# Patient Record
Sex: Female | Born: 1985
Health system: Southern US, Community
[De-identification: ages and names within clinical notes are randomized; demographics above are authoritative.]

## PROBLEM LIST (undated history)

## (undated) DIAGNOSIS — N979 Female infertility, unspecified: Secondary | ICD-10-CM

## (undated) DIAGNOSIS — F419 Anxiety disorder, unspecified: Secondary | ICD-10-CM

## (undated) DIAGNOSIS — O3660X0 Maternal care for excessive fetal growth, unspecified trimester, not applicable or unspecified: Secondary | ICD-10-CM

## (undated) HISTORY — PX: WISDOM TOOTH EXTRACTION: SHX21

---

## 2016-08-28 DIAGNOSIS — Z01419 Encounter for gynecological examination (general) (routine) without abnormal findings: Secondary | ICD-10-CM | POA: Diagnosis not present

## 2016-08-28 DIAGNOSIS — N970 Female infertility associated with anovulation: Secondary | ICD-10-CM | POA: Diagnosis not present

## 2016-08-28 DIAGNOSIS — Z6831 Body mass index (BMI) 31.0-31.9, adult: Secondary | ICD-10-CM | POA: Diagnosis not present

## 2016-09-09 DIAGNOSIS — R112 Nausea with vomiting, unspecified: Secondary | ICD-10-CM | POA: Diagnosis not present

## 2016-09-09 DIAGNOSIS — K529 Noninfective gastroenteritis and colitis, unspecified: Secondary | ICD-10-CM | POA: Diagnosis not present

## 2017-04-02 DIAGNOSIS — F411 Generalized anxiety disorder: Secondary | ICD-10-CM | POA: Diagnosis not present

## 2017-05-09 DIAGNOSIS — F411 Generalized anxiety disorder: Secondary | ICD-10-CM | POA: Diagnosis not present

## 2017-05-27 NOTE — L&D Delivery Note (Signed)
Delivery Note At 12:54 PM a viable and healthy female was delivered via Vaginal, Spontaneous (Presentation:LOA  ).  APGAR: 8, 9; weight pending .   Placenta status: spontaneous, intact.  Cord:spontaneous  with the following complications: none.  Cord pH: na  Anesthesia:  epidural Episiotomy: None Lacerations:  second Suture Repair: 2.0 3.0 vicryl rapide Est. Blood Loss (mL):  150  Mom to postpartum.  Baby to Couplet care / Skin to Skin.  Yamaris Cummings J 05/20/2018, 1:12 PM

## 2017-07-07 DIAGNOSIS — F331 Major depressive disorder, recurrent, moderate: Secondary | ICD-10-CM | POA: Diagnosis not present

## 2017-07-07 DIAGNOSIS — F411 Generalized anxiety disorder: Secondary | ICD-10-CM | POA: Diagnosis not present

## 2017-07-24 DIAGNOSIS — Z3181 Encounter for male factor infertility in female patient: Secondary | ICD-10-CM | POA: Diagnosis not present

## 2017-08-25 DIAGNOSIS — N970 Female infertility associated with anovulation: Secondary | ICD-10-CM | POA: Diagnosis not present

## 2017-08-28 DIAGNOSIS — Z3189 Encounter for other procreative management: Secondary | ICD-10-CM | POA: Diagnosis not present

## 2017-09-03 DIAGNOSIS — Z01419 Encounter for gynecological examination (general) (routine) without abnormal findings: Secondary | ICD-10-CM | POA: Diagnosis not present

## 2017-09-03 DIAGNOSIS — Z6830 Body mass index (BMI) 30.0-30.9, adult: Secondary | ICD-10-CM | POA: Diagnosis not present

## 2017-09-18 DIAGNOSIS — Z3A01 Less than 8 weeks gestation of pregnancy: Secondary | ICD-10-CM | POA: Diagnosis not present

## 2017-09-18 DIAGNOSIS — O0900 Supervision of pregnancy with history of infertility, unspecified trimester: Secondary | ICD-10-CM | POA: Diagnosis not present

## 2017-09-18 DIAGNOSIS — Z3201 Encounter for pregnancy test, result positive: Secondary | ICD-10-CM | POA: Diagnosis not present

## 2017-09-22 DIAGNOSIS — Z3A01 Less than 8 weeks gestation of pregnancy: Secondary | ICD-10-CM | POA: Diagnosis not present

## 2017-09-22 DIAGNOSIS — O0901 Supervision of pregnancy with history of infertility, first trimester: Secondary | ICD-10-CM | POA: Diagnosis not present

## 2017-10-09 DIAGNOSIS — Z3201 Encounter for pregnancy test, result positive: Secondary | ICD-10-CM | POA: Diagnosis not present

## 2017-10-21 DIAGNOSIS — Z3401 Encounter for supervision of normal first pregnancy, first trimester: Secondary | ICD-10-CM | POA: Diagnosis not present

## 2017-10-21 DIAGNOSIS — Z3689 Encounter for other specified antenatal screening: Secondary | ICD-10-CM | POA: Diagnosis not present

## 2017-10-21 LAB — OB RESULTS CONSOLE ABO/RH: RH Type: POSITIVE

## 2017-10-21 LAB — OB RESULTS CONSOLE RPR: RPR: NONREACTIVE

## 2017-10-21 LAB — OB RESULTS CONSOLE ANTIBODY SCREEN: Antibody Screen: NEGATIVE

## 2017-10-21 LAB — OB RESULTS CONSOLE HIV ANTIBODY (ROUTINE TESTING): HIV: NONREACTIVE

## 2017-10-21 LAB — OB RESULTS CONSOLE RUBELLA ANTIBODY, IGM: Rubella: IMMUNE

## 2017-10-21 LAB — OB RESULTS CONSOLE HEPATITIS B SURFACE ANTIGEN: Hepatitis B Surface Ag: NEGATIVE

## 2017-10-28 DIAGNOSIS — O0900 Supervision of pregnancy with history of infertility, unspecified trimester: Secondary | ICD-10-CM | POA: Diagnosis not present

## 2017-10-28 DIAGNOSIS — Z3A10 10 weeks gestation of pregnancy: Secondary | ICD-10-CM | POA: Diagnosis not present

## 2017-10-28 DIAGNOSIS — Z3401 Encounter for supervision of normal first pregnancy, first trimester: Secondary | ICD-10-CM | POA: Diagnosis not present

## 2017-10-28 DIAGNOSIS — O0901 Supervision of pregnancy with history of infertility, first trimester: Secondary | ICD-10-CM | POA: Diagnosis not present

## 2017-10-28 DIAGNOSIS — Z118 Encounter for screening for other infectious and parasitic diseases: Secondary | ICD-10-CM | POA: Diagnosis not present

## 2017-10-28 LAB — OB RESULTS CONSOLE GC/CHLAMYDIA
CHLAMYDIA, DNA PROBE: NEGATIVE
GC PROBE AMP, GENITAL: NEGATIVE

## 2017-11-12 DIAGNOSIS — F411 Generalized anxiety disorder: Secondary | ICD-10-CM | POA: Diagnosis not present

## 2017-11-12 DIAGNOSIS — F331 Major depressive disorder, recurrent, moderate: Secondary | ICD-10-CM | POA: Diagnosis not present

## 2017-11-24 DIAGNOSIS — Z3A14 14 weeks gestation of pregnancy: Secondary | ICD-10-CM | POA: Diagnosis not present

## 2017-11-24 DIAGNOSIS — O0902 Supervision of pregnancy with history of infertility, second trimester: Secondary | ICD-10-CM | POA: Diagnosis not present

## 2017-12-08 DIAGNOSIS — Z361 Encounter for antenatal screening for raised alphafetoprotein level: Secondary | ICD-10-CM | POA: Diagnosis not present

## 2017-12-08 DIAGNOSIS — O0902 Supervision of pregnancy with history of infertility, second trimester: Secondary | ICD-10-CM | POA: Diagnosis not present

## 2017-12-08 DIAGNOSIS — Z3A16 16 weeks gestation of pregnancy: Secondary | ICD-10-CM | POA: Diagnosis not present

## 2017-12-29 DIAGNOSIS — O0902 Supervision of pregnancy with history of infertility, second trimester: Secondary | ICD-10-CM | POA: Diagnosis not present

## 2017-12-29 DIAGNOSIS — Z363 Encounter for antenatal screening for malformations: Secondary | ICD-10-CM | POA: Diagnosis not present

## 2017-12-29 DIAGNOSIS — Z3A19 19 weeks gestation of pregnancy: Secondary | ICD-10-CM | POA: Diagnosis not present

## 2017-12-31 DIAGNOSIS — F331 Major depressive disorder, recurrent, moderate: Secondary | ICD-10-CM | POA: Diagnosis not present

## 2017-12-31 DIAGNOSIS — F411 Generalized anxiety disorder: Secondary | ICD-10-CM | POA: Diagnosis not present

## 2018-01-28 DIAGNOSIS — O0902 Supervision of pregnancy with history of infertility, second trimester: Secondary | ICD-10-CM | POA: Diagnosis not present

## 2018-01-28 DIAGNOSIS — Z3A23 23 weeks gestation of pregnancy: Secondary | ICD-10-CM | POA: Diagnosis not present

## 2018-02-26 DIAGNOSIS — F331 Major depressive disorder, recurrent, moderate: Secondary | ICD-10-CM | POA: Diagnosis not present

## 2018-02-26 DIAGNOSIS — Z23 Encounter for immunization: Secondary | ICD-10-CM | POA: Diagnosis not present

## 2018-02-26 DIAGNOSIS — Z3A28 28 weeks gestation of pregnancy: Secondary | ICD-10-CM | POA: Diagnosis not present

## 2018-02-26 DIAGNOSIS — F411 Generalized anxiety disorder: Secondary | ICD-10-CM | POA: Diagnosis not present

## 2018-02-26 DIAGNOSIS — Z3689 Encounter for other specified antenatal screening: Secondary | ICD-10-CM | POA: Diagnosis not present

## 2018-02-26 DIAGNOSIS — O0902 Supervision of pregnancy with history of infertility, second trimester: Secondary | ICD-10-CM | POA: Diagnosis not present

## 2018-03-05 DIAGNOSIS — O99810 Abnormal glucose complicating pregnancy: Secondary | ICD-10-CM | POA: Diagnosis not present

## 2018-03-05 DIAGNOSIS — Z3A29 29 weeks gestation of pregnancy: Secondary | ICD-10-CM | POA: Diagnosis not present

## 2018-03-10 DIAGNOSIS — Z3A29 29 weeks gestation of pregnancy: Secondary | ICD-10-CM | POA: Diagnosis not present

## 2018-03-10 DIAGNOSIS — O0902 Supervision of pregnancy with history of infertility, second trimester: Secondary | ICD-10-CM | POA: Diagnosis not present

## 2018-03-24 DIAGNOSIS — Z3A31 31 weeks gestation of pregnancy: Secondary | ICD-10-CM | POA: Diagnosis not present

## 2018-03-24 DIAGNOSIS — O0903 Supervision of pregnancy with history of infertility, third trimester: Secondary | ICD-10-CM | POA: Diagnosis not present

## 2018-04-06 DIAGNOSIS — O0903 Supervision of pregnancy with history of infertility, third trimester: Secondary | ICD-10-CM | POA: Diagnosis not present

## 2018-04-06 DIAGNOSIS — Z3A33 33 weeks gestation of pregnancy: Secondary | ICD-10-CM | POA: Diagnosis not present

## 2018-04-22 DIAGNOSIS — Z3685 Encounter for antenatal screening for Streptococcus B: Secondary | ICD-10-CM | POA: Diagnosis not present

## 2018-04-22 DIAGNOSIS — Z3A35 35 weeks gestation of pregnancy: Secondary | ICD-10-CM | POA: Diagnosis not present

## 2018-04-22 DIAGNOSIS — O0903 Supervision of pregnancy with history of infertility, third trimester: Secondary | ICD-10-CM | POA: Diagnosis not present

## 2018-04-22 LAB — OB RESULTS CONSOLE GBS: STREP GROUP B AG: NEGATIVE

## 2018-04-27 DIAGNOSIS — Z3A36 36 weeks gestation of pregnancy: Secondary | ICD-10-CM | POA: Diagnosis not present

## 2018-04-27 DIAGNOSIS — O0903 Supervision of pregnancy with history of infertility, third trimester: Secondary | ICD-10-CM | POA: Diagnosis not present

## 2018-05-06 DIAGNOSIS — Z3A37 37 weeks gestation of pregnancy: Secondary | ICD-10-CM | POA: Diagnosis not present

## 2018-05-06 DIAGNOSIS — O0903 Supervision of pregnancy with history of infertility, third trimester: Secondary | ICD-10-CM | POA: Diagnosis not present

## 2018-05-13 DIAGNOSIS — O3663X0 Maternal care for excessive fetal growth, third trimester, not applicable or unspecified: Secondary | ICD-10-CM | POA: Diagnosis not present

## 2018-05-13 DIAGNOSIS — Z3A38 38 weeks gestation of pregnancy: Secondary | ICD-10-CM | POA: Diagnosis not present

## 2018-05-15 ENCOUNTER — Other Ambulatory Visit: Payer: Self-pay | Admitting: Obstetrics and Gynecology

## 2018-05-19 ENCOUNTER — Inpatient Hospital Stay (HOSPITAL_COMMUNITY): Payer: BLUE CROSS/BLUE SHIELD | Admitting: Anesthesiology

## 2018-05-19 ENCOUNTER — Encounter (HOSPITAL_COMMUNITY): Payer: Self-pay

## 2018-05-19 ENCOUNTER — Encounter (HOSPITAL_COMMUNITY): Payer: Self-pay | Admitting: *Deleted

## 2018-05-19 ENCOUNTER — Inpatient Hospital Stay (HOSPITAL_BASED_OUTPATIENT_CLINIC_OR_DEPARTMENT_OTHER): Payer: BLUE CROSS/BLUE SHIELD

## 2018-05-19 ENCOUNTER — Inpatient Hospital Stay (EMERGENCY_DEPARTMENT_HOSPITAL)
Admission: AD | Admit: 2018-05-19 | Discharge: 2018-05-19 | Disposition: A | Discharge status: Home / Self Care | Payer: BLUE CROSS/BLUE SHIELD | Source: Home / Self Care | Attending: Obstetrics and Gynecology | Admitting: Obstetrics and Gynecology

## 2018-05-19 ENCOUNTER — Inpatient Hospital Stay (HOSPITAL_COMMUNITY)
Admission: AD | Admit: 2018-05-19 | Discharge: 2018-05-22 | DRG: 806 | Disposition: A | Discharge status: Home / Self Care | Payer: BLUE CROSS/BLUE SHIELD | Attending: Obstetrics and Gynecology | Admitting: Obstetrics and Gynecology

## 2018-05-19 DIAGNOSIS — Z3490 Encounter for supervision of normal pregnancy, unspecified, unspecified trimester: Secondary | ICD-10-CM

## 2018-05-19 DIAGNOSIS — D62 Acute posthemorrhagic anemia: Secondary | ICD-10-CM | POA: Diagnosis not present

## 2018-05-19 DIAGNOSIS — N939 Abnormal uterine and vaginal bleeding, unspecified: Secondary | ICD-10-CM

## 2018-05-19 DIAGNOSIS — Z3A39 39 weeks gestation of pregnancy: Secondary | ICD-10-CM

## 2018-05-19 DIAGNOSIS — O4693 Antepartum hemorrhage, unspecified, third trimester: Secondary | ICD-10-CM

## 2018-05-19 DIAGNOSIS — F329 Major depressive disorder, single episode, unspecified: Secondary | ICD-10-CM | POA: Diagnosis present

## 2018-05-19 DIAGNOSIS — O3663X0 Maternal care for excessive fetal growth, third trimester, not applicable or unspecified: Principal | ICD-10-CM | POA: Diagnosis present

## 2018-05-19 DIAGNOSIS — O26893 Other specified pregnancy related conditions, third trimester: Secondary | ICD-10-CM | POA: Diagnosis not present

## 2018-05-19 DIAGNOSIS — O99344 Other mental disorders complicating childbirth: Secondary | ICD-10-CM | POA: Diagnosis present

## 2018-05-19 DIAGNOSIS — O9081 Anemia of the puerperium: Secondary | ICD-10-CM | POA: Diagnosis not present

## 2018-05-19 DIAGNOSIS — Z3483 Encounter for supervision of other normal pregnancy, third trimester: Secondary | ICD-10-CM | POA: Diagnosis not present

## 2018-05-19 HISTORY — DX: Maternal care for excessive fetal growth, unspecified trimester, not applicable or unspecified: O36.60X0

## 2018-05-19 HISTORY — DX: Anxiety disorder, unspecified: F41.9

## 2018-05-19 HISTORY — DX: Female infertility, unspecified: N97.9

## 2018-05-19 LAB — CBC
HCT: 35 % — ABNORMAL LOW (ref 36.0–46.0)
Hemoglobin: 11.4 g/dL — ABNORMAL LOW (ref 12.0–15.0)
MCH: 29 pg (ref 26.0–34.0)
MCHC: 32.6 g/dL (ref 30.0–36.0)
MCV: 89.1 fL (ref 80.0–100.0)
Platelets: 210 10*3/uL (ref 150–400)
RBC: 3.93 MIL/uL (ref 3.87–5.11)
RDW: 14.9 % (ref 11.5–15.5)
WBC: 10.8 10*3/uL — ABNORMAL HIGH (ref 4.0–10.5)
nRBC: 0 % (ref 0.0–0.2)

## 2018-05-19 LAB — WET PREP, GENITAL
Clue Cells Wet Prep HPF POC: NONE SEEN
Sperm: NONE SEEN
Trich, Wet Prep: NONE SEEN
Yeast Wet Prep HPF POC: NONE SEEN

## 2018-05-19 LAB — TYPE AND SCREEN
ABO/RH(D): O POS
Antibody Screen: NEGATIVE

## 2018-05-19 IMAGING — US US MFM OB LIMITED
1 series · 15 of 19 positions shown · non-contrast
Comparison: none

[Series 1: us mfm ob limited · 19 acquisitions, 15 frames shown]
[im 1/19]
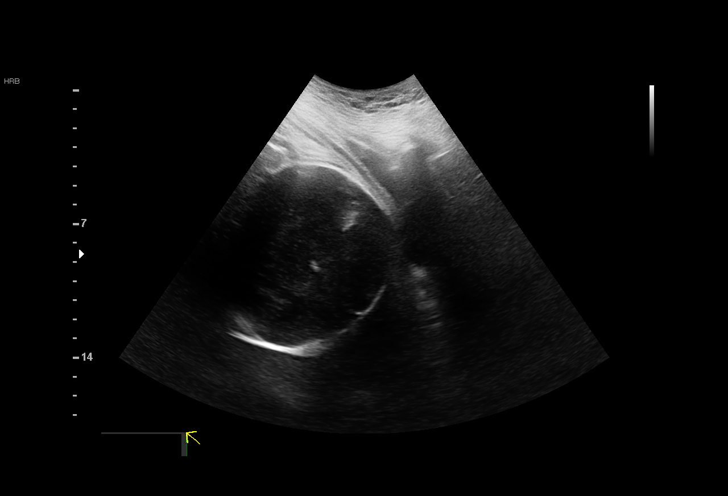
[im 2/19]
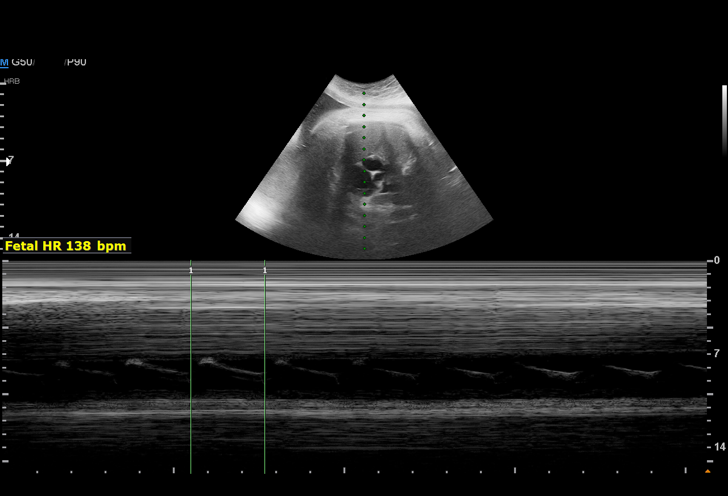
[im 4/19]
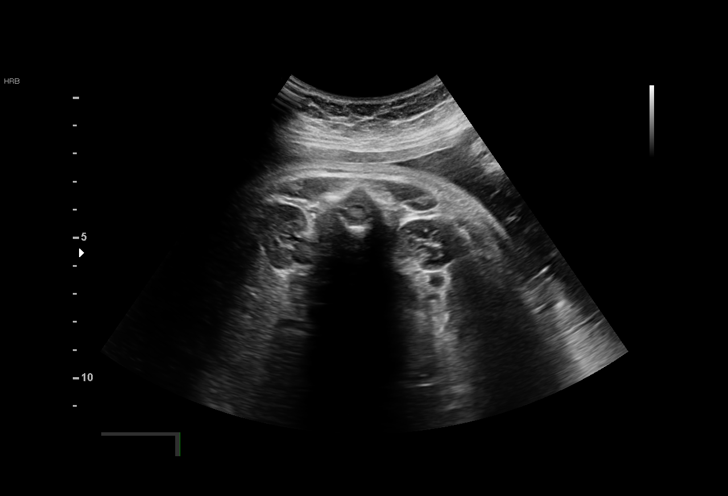
[im 5/19]
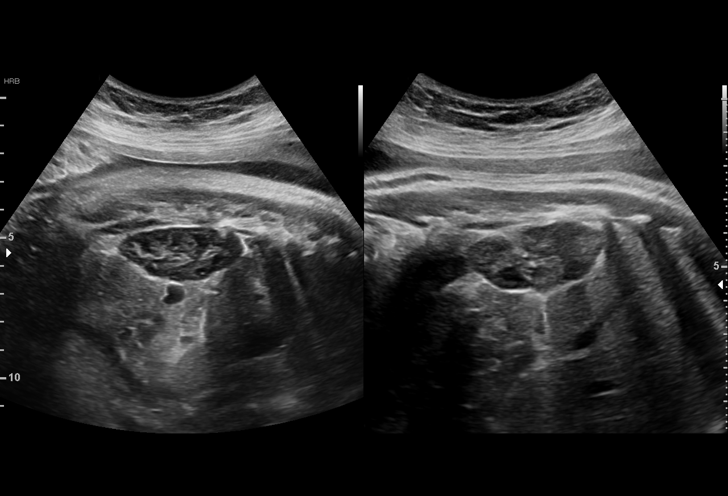
[im 6/19]
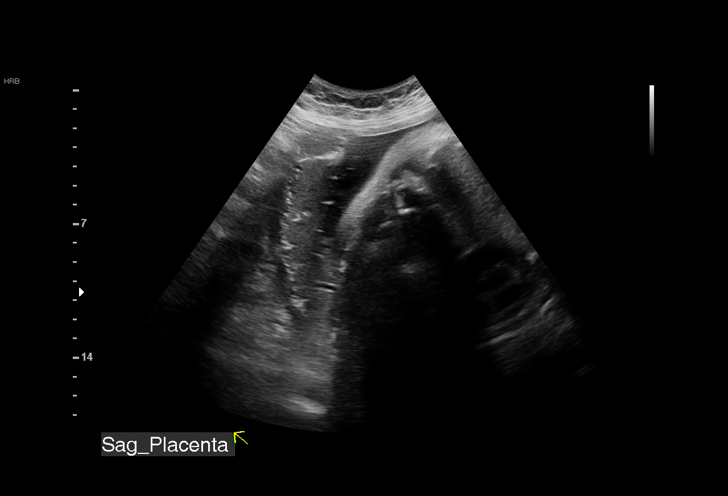
[im 7/19]
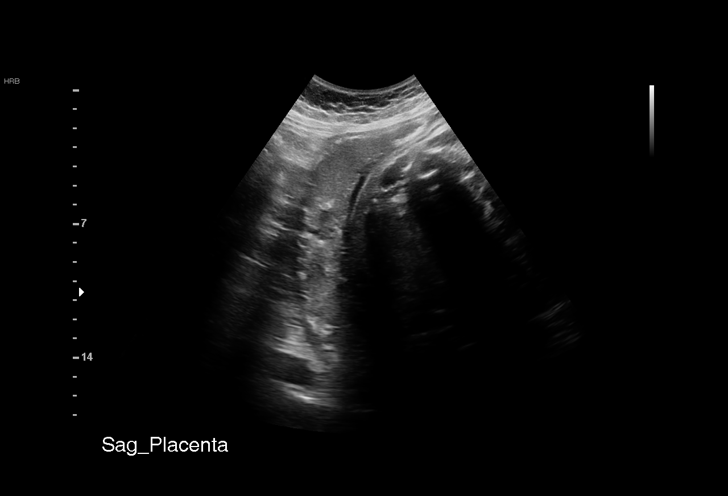
[im 9/19]
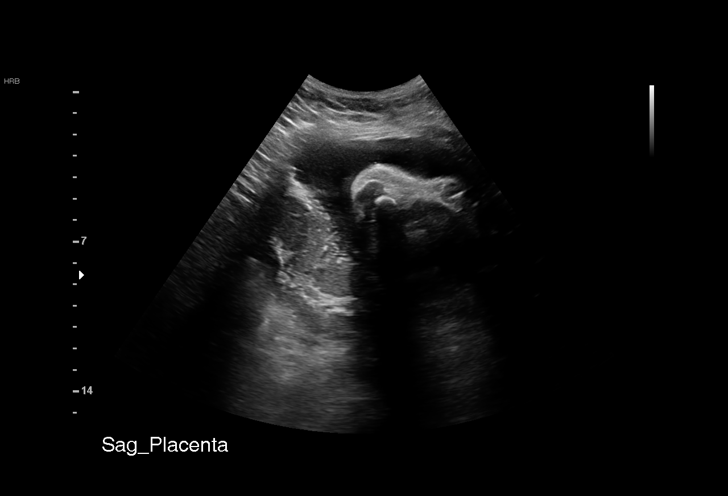
[im 10/19]
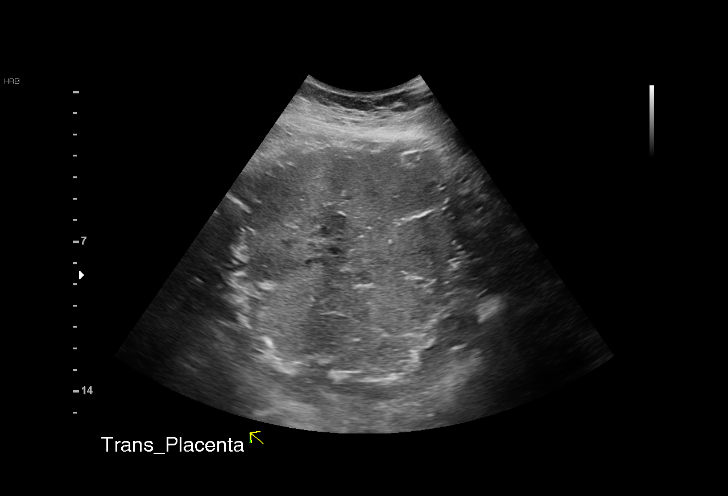
[im 11/19]
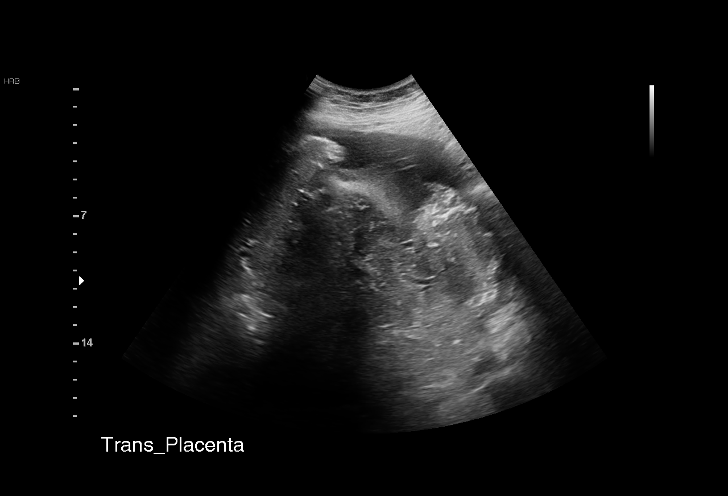
[im 13/19]
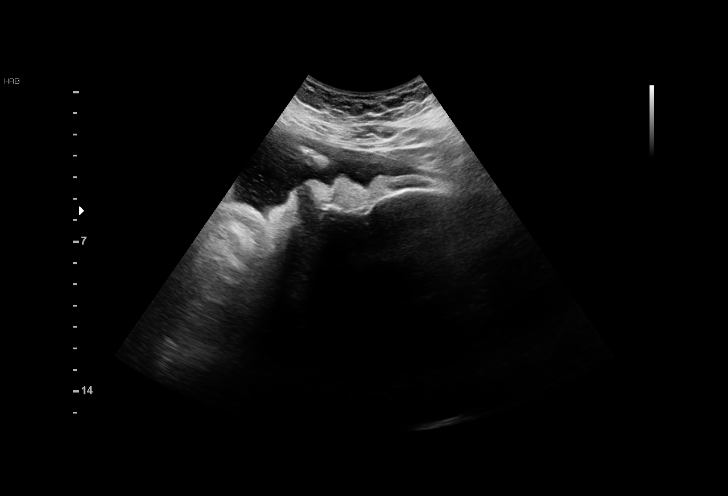
[im 14/19]
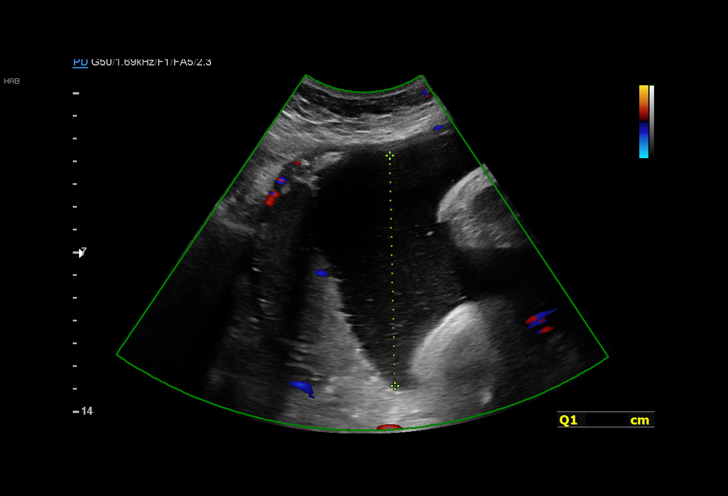
[im 15/19]
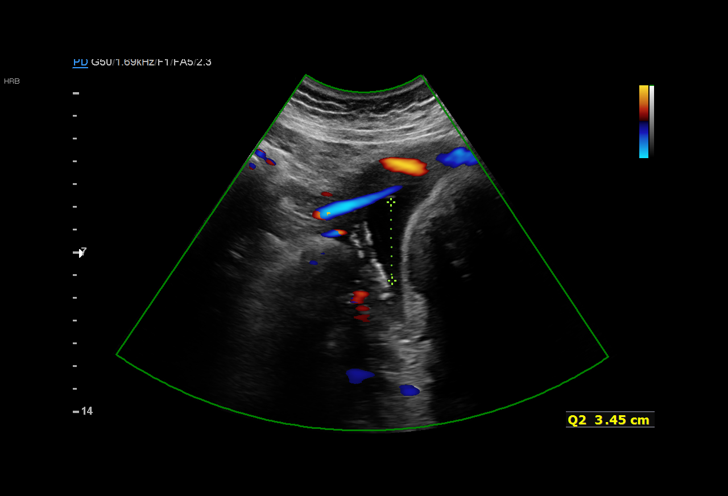
[im 16/19]
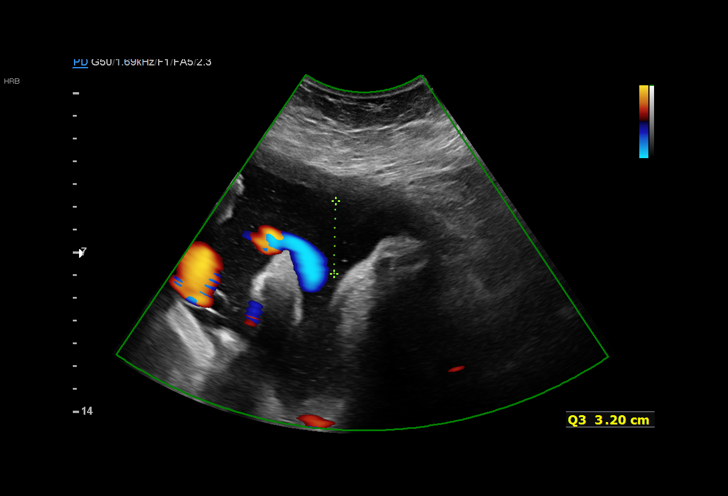
[im 18/19]
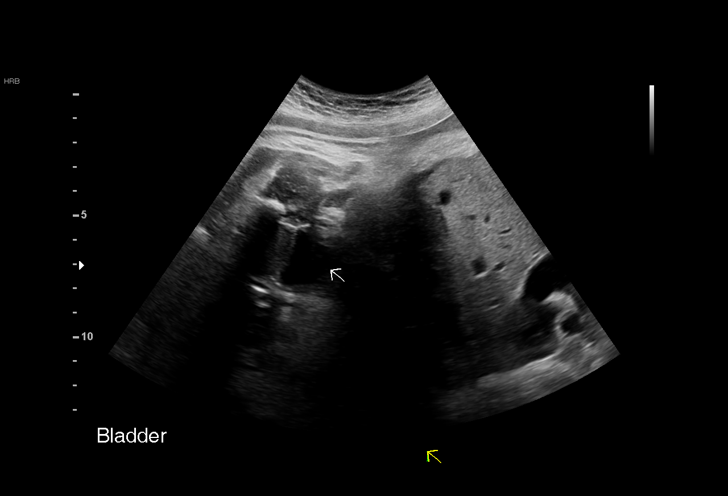
[im 19/19]
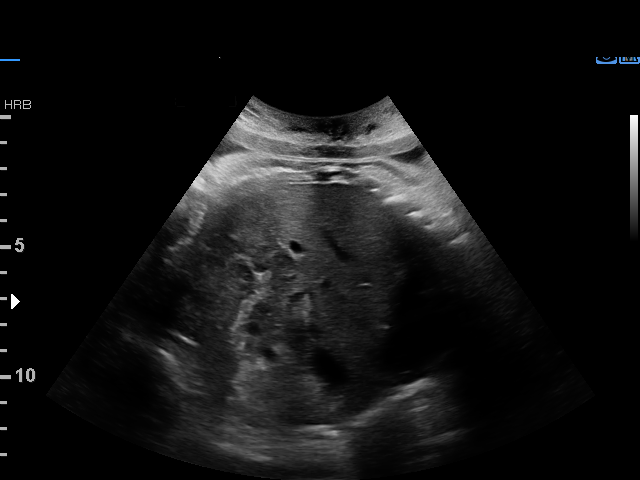

[15 of 19 positions shown; findings below may reference images not displayed]

1  US MFM OB LIMITED                    76815.01     ROSOBEL
 ----------------------------------------------------------------------

 ----------------------------------------------------------------------
Indications

  Vaginal bleeding in pregnancy, third           [47]
  trimester (brown blood in underware)
  Pelvic pain affecting pregnancy in third       [47]
  trimester (lower abdominal cramping- like
  period cramps)
  39 weeks gestation of pregnancy
 ----------------------------------------------------------------------
Vital Signs

 BMI:
Fetal Evaluation

 Num Of Fetuses:          1
 Fetal Heart Rate(bpm):   138
 Cardiac Activity:        Observed
 Presentation:            Cephalic
 Placenta:                Posterior

 Amniotic Fluid
 AFI FV:      Within normal limits

 AFI Sum(cm)     %Tile       Largest Pocket(cm)
 21.[REDACTED]

 RUQ(cm)       RLQ(cm)       LUQ(cm)        LLQ(cm)

OB History

 Gravidity:    1
Gestational Age
 Best:          39w 5d     Det. By:  Previous Ultrasound      EDD:   [DATE]
Anatomy

 Thoracic:              Appears normal         Abdomen:                Appears normal
 Diaphragm:             Appears normal         Kidneys:                Appear normal
 Stomach:               Appears normal, left   Bladder:                Appears normal
                        sided
Cervix Uterus Adnexa

 Cervix
 Not visualized (advanced GA >[47])
Impression

 No evidence of placenta previa or placental abruption
 Vaginal bleeding
Recommendations

 Follow up as clinically indicated.

## 2018-05-19 MED ORDER — FENTANYL 2.5 MCG/ML BUPIVACAINE 1/10 % EPIDURAL INFUSION (WH - ANES)
INTRAMUSCULAR | Status: AC
  Filled 2018-05-19: qty 100

## 2018-05-19 MED ORDER — LACTATED RINGERS IV SOLN
INTRAVENOUS | Status: DC
  Administered 2018-05-19 – 2018-05-20 (×3): via INTRAVENOUS

## 2018-05-19 MED ORDER — OXYTOCIN BOLUS FROM INFUSION
500.0000 mL | Freq: Once | INTRAVENOUS | Status: AC
  Administered 2018-05-20: 500 mL via INTRAVENOUS

## 2018-05-19 MED ORDER — ACETAMINOPHEN 325 MG PO TABS: 650.0000 mg | ORAL_TABLET | ORAL | Status: DC | PRN

## 2018-05-19 MED ORDER — SOD CITRATE-CITRIC ACID 500-334 MG/5ML PO SOLN: 30.0000 mL | ORAL | Status: DC | PRN

## 2018-05-19 MED ORDER — LIDOCAINE HCL (PF) 1 % IJ SOLN
30.0000 mL | INTRAMUSCULAR | Status: DC | PRN
  Administered 2018-05-20: 30 mL via SUBCUTANEOUS
  Filled 2018-05-19: qty 30

## 2018-05-19 MED ORDER — ONDANSETRON HCL 4 MG/2ML IJ SOLN: 4.0000 mg | Freq: Four times a day (QID) | INTRAMUSCULAR | Status: DC | PRN

## 2018-05-19 MED ORDER — LACTATED RINGERS IV SOLN
500.0000 mL | INTRAVENOUS | Status: DC | PRN
  Administered 2018-05-19: 500 mL via INTRAVENOUS

## 2018-05-19 MED ORDER — PHENYLEPHRINE 40 MCG/ML (10ML) SYRINGE FOR IV PUSH (FOR BLOOD PRESSURE SUPPORT)
PREFILLED_SYRINGE | INTRAVENOUS | Status: AC
  Filled 2018-05-19: qty 10

## 2018-05-19 MED ORDER — OXYTOCIN 40 UNITS IN LACTATED RINGERS INFUSION - SIMPLE MED: 2.5000 [IU]/h | INTRAVENOUS | Status: DC

## 2018-05-19 MED ORDER — LIDOCAINE HCL (PF) 1 % IJ SOLN
INTRAMUSCULAR | Status: DC | PRN
  Administered 2018-05-19: 2 mL via EPIDURAL
  Administered 2018-05-19: 5 mL via EPIDURAL
  Administered 2018-05-19: 3 mL via EPIDURAL

## 2018-05-19 MED ORDER — FENTANYL 2.5 MCG/ML BUPIVACAINE 1/10 % EPIDURAL INFUSION (WH - ANES)
INTRAMUSCULAR | Status: DC | PRN
  Administered 2018-05-19: 14 mL/h via EPIDURAL

## 2018-05-19 NOTE — Progress Notes (Signed)
All evals from 2208 assessed and performed by Harmon Dunhandra P RN

## 2018-05-19 NOTE — MAU Note (Signed)
Urine in the lab  

## 2018-05-19 NOTE — Anesthesia Preprocedure Evaluation (Signed)
Anesthesia Evaluation  Patient identified by MRN, date of birth, ID band Patient awake    Reviewed: Allergy & Precautions, Patient's Chart, lab work & pertinent test results  Airway Mallampati: II  TM Distance: >3 FB     Dental   Pulmonary neg pulmonary ROS,    breath sounds clear to auscultation       Cardiovascular negative cardio ROS   Rhythm:Regular Rate:Normal     Neuro/Psych negative neurological ROS     GI/Hepatic negative GI ROS, Neg liver ROS,   Endo/Other  negative endocrine ROS  Renal/GU negative Renal ROS     Musculoskeletal   Abdominal   Peds  Hematology negative hematology ROS (+)   Anesthesia Other Findings   Reproductive/Obstetrics (+) Pregnancy                             Lab Results  Component Value Date   WBC 10.8 (H) 05/19/2018   HGB 11.4 (L) 05/19/2018   HCT 35.0 (L) 05/19/2018   MCV 89.1 05/19/2018   PLT 210 05/19/2018   No results found for: CREATININE, BUN, NA, K, CL, CO2  Anesthesia Physical Anesthesia Plan  ASA: II  Anesthesia Plan: Epidural   Post-op Pain Management:    Induction:   PONV Risk Score and Plan: Treatment may vary due to age or medical condition  Airway Management Planned: Natural Airway  Additional Equipment:   Intra-op Plan:   Post-operative Plan:   Informed Consent: I have reviewed the patients History and Physical, chart, labs and discussed the procedure including the risks, benefits and alternatives for the proposed anesthesia with the patient or authorized representative who has indicated his/her understanding and acceptance.     Plan Discussed with:   Anesthesia Plan Comments:         Anesthesia Quick Evaluation

## 2018-05-19 NOTE — MAU Note (Signed)
Pt has had ctx since this morning. About 6 min apart. Bloody show noted since this morning. Good fetal movement felt. 2cm dilated earlier today

## 2018-05-19 NOTE — MAU Note (Signed)
Pt states she woke up this morning, went to BR, noted brownish blood in her underwear & in the toilet.  Also having lower abdominal cramping.  Reports good fetal movement.

## 2018-05-19 NOTE — Progress Notes (Signed)
All evals from 2205 assessed and performed by Harmon Dunhandra P RN

## 2018-05-19 NOTE — Progress Notes (Signed)
Fetal tachy 180bpm from 2241-2150. FHR changed back to baseline of 130bpm 2252. Dorris FetchKathy W RN (charge) informed. No interventions recommended at this time.   Adah Perlhandra Viviene Thurston RN

## 2018-05-19 NOTE — H&P (Signed)
Jill Ruiz is a 32 y.o. female presenting for contractions and early labor. OB History    Gravida  1   Para      Term      Preterm      AB      Living        SAB      TAB      Ectopic      Multiple      Live Births             Past Medical History:  Diagnosis Date  . Infertility, female   . LGA (large for gestational age) fetus affecting management of mother    Past Surgical History:  Procedure Laterality Date  . WISDOM TOOTH EXTRACTION     Family History: family history includes Cancer in her mother. Social History:  reports that she has never smoked. She has never used smokeless tobacco. She reports previous alcohol use. She reports that she does not use drugs.     Maternal Diabetes: No Genetic Screening: Normal Maternal Ultrasounds/Referrals: Normal Fetal Ultrasounds or other Referrals:  None Maternal Substance Abuse:  No Significant Maternal Medications:  None Significant Maternal Lab Results:  None Other Comments:  None  Review of Systems  Constitutional: Negative.   All other systems reviewed and are negative.  Maternal Medical History:  Reason for admission: Contractions.   Contractions: Onset was 3-5 hours ago.   Frequency: irregular.   Perceived severity is moderate.    Fetal activity: Perceived fetal activity is normal.   Last perceived fetal movement was within the past hour.    Prenatal complications: no prenatal complications Prenatal Complications - Diabetes: none.    Dilation: 5.5(5.5-6) Effacement (%): 80(80-90) Station: 0 Exam by:: Jill Perlhandra Phillips RN Blood pressure 127/87, pulse (!) 107, temperature (!) 97.5 F (36.4 C), resp. rate 18. Maternal Exam:  Uterine Assessment: Contraction strength is moderate.  Contraction frequency is irregular.   Abdomen: Patient reports no abdominal tenderness. Fetal presentation: vertex  Introitus: Normal vulva. Normal vagina.  Ferning test: not done.  Nitrazine test: not  done. Amniotic fluid character: not assessed.  Pelvis: questionable for delivery.   Cervix: Cervix evaluated by digital exam.     Physical Exam  Nursing note and vitals reviewed. Constitutional: She is oriented to person, place, and time. She appears well-developed and well-nourished.  HENT:  Head: Normocephalic and atraumatic.  Neck: Normal range of motion. Neck supple.  Cardiovascular: Normal rate and regular rhythm.  Respiratory: Effort normal and breath sounds normal.  GI: Soft. Bowel sounds are normal.  Genitourinary:    Vulva, vagina and uterus normal.   Musculoskeletal: Normal range of motion.  Neurological: She is alert and oriented to person, place, and time. She has normal reflexes.  Skin: Skin is warm and dry.  Psychiatric: She has a normal mood and affect.    Prenatal labs: ABO, Rh: O/Positive/-- (05/28 0000) Antibody: Negative (05/28 0000) Rubella: Immune (05/28 0000) RPR: Nonreactive (05/28 0000)  HBsAg: Negative (05/28 0000)  HIV: Non-reactive (05/28 0000)  GBS: Negative (11/27 0000)   Assessment/Plan: Term IUP Early labor Presumed LGA Admit   Jill Ruiz J 05/19/2018, 11:03 PM

## 2018-05-19 NOTE — Anesthesia Procedure Notes (Signed)
Epidural Patient location during procedure: OB Start time: 05/19/2018 11:33 PM End time: 05/19/2018 11:39 PM  Staffing Anesthesiologist: Marcene DuosFitzgerald, Hillis Mcphatter, MD Performed: anesthesiologist   Preanesthetic Checklist Completed: patient identified, site marked, surgical consent, pre-op evaluation, timeout performed, IV checked, risks and benefits discussed and monitors and equipment checked  Epidural Patient position: sitting Prep: site prepped and draped and DuraPrep Patient monitoring: continuous pulse ox and blood pressure Approach: midline Location: L4-L5 Injection technique: LOR air  Needle:  Needle type: Tuohy  Needle gauge: 17 G Needle length: 9 cm and 9 Needle insertion depth: 7 cm Catheter type: closed end flexible Catheter size: 19 Gauge Catheter at skin depth: 12 cm Test dose: negative  Assessment Events: blood not aspirated, injection not painful, no injection resistance, negative IV test and no paresthesia

## 2018-05-19 NOTE — MAU Provider Note (Signed)
Patient Jill Ruiz is a G1P0 At 1672w5d here with complaints of spotting this morning when she used the bathroom. She denies HA, blurry vision, nausea vomiting, LOF or decreased fetal movements. She feels some cramping. Last cervical exam was last week and she was 1 cm; she has not had intercourse in the past 24 hours.  History     CSN: [id number]  Arrival date and time: 05/19/18 0702   First Provider Initiated Contact with Patient 05/19/18 [phone]      Chief Complaint  Patient presents with  . Vaginal Bleeding  . Abdominal Pain   Vaginal Bleeding  The patient's primary symptoms include vaginal bleeding. The patient's pertinent negatives include no genital itching or genital lesions. This is a new problem. The current episode started today. The problem occurs rarely. The problem has been resolved. The pain is mild. Associated symptoms include diarrhea. Pertinent negatives include no constipation, urgency or vomiting. The vaginal discharge was bloody. The vaginal bleeding is spotting. She has not been passing clots. She has not been passing tissue. Nothing aggravates the symptoms. She has tried nothing for the symptoms.  Patient states that she went to the bathroom this morning and had some brown and pink discharge on her toilet paper and in the toilet. Has not had any bleeding since then.  She has some abdominal pain that she rates a 2/10; feels like cramping  OB History    Gravida  1   Para      Term      Preterm      AB      Living        SAB      TAB      Ectopic      Multiple      Live Births              Past Medical History:  Diagnosis Date  . Medical history non-contributory     Past Surgical History:  Procedure Laterality Date  . WISDOM TOOTH EXTRACTION      No family history on file.  Social History   Tobacco Use  . Smoking status: Never Smoker  . Smokeless tobacco: Never Used  Substance Use Topics  . Alcohol use: Not Currently  . Drug  use: Never    Allergies: Allergies not on file  No medications prior to admission.    Review of Systems  Constitutional: Negative.   HENT: Negative.   Respiratory: Negative.   Gastrointestinal: Positive for diarrhea. Negative for constipation and vomiting.  Genitourinary: Positive for vaginal bleeding. Negative for urgency.  Musculoskeletal: Negative.   Neurological: Negative.   Psychiatric/Behavioral: Negative.    Physical Exam   Blood pressure 116/77, pulse 98, temperature 97.7 F (36.5 C), temperature source Oral, resp. rate 18, height 5' 4.5" (1.638 m), weight 93.4 kg.  Physical Exam  Constitutional: She appears well-developed and well-nourished.  HENT:  Head: Normocephalic.  Neck: Normal range of motion.  Genitourinary:    Genitourinary Comments: External vagina is normal, no lesions. Dark brown mucous discharge in the vagina; upon touching speculum to cervix dark brown blood oozed from the os; did not fill speculum and did not continue bleeding.    Musculoskeletal: Normal range of motion.  Neurological: She is alert.  Skin: Skin is warm and dry.  Psychiatric: She has a normal mood and affect.    MAU Course  Procedures  MDM -wet prep negative -NST: 120 bpm, mod var, present acel, neg decels, conts  q 5 mintes -US shows no sign of placental abruption -Discussed with Dr. Billy Coastaavon if he prefers to wait or induce the patient today; Dr. Billy Coastaavon recommends waiting. Patient is amenable to this plan.   Assessment and Plan   1. Vaginal bleeding    2. Patient stable for discharge with labor and return precautions (bleeding, etc).   3. Reviewed when to call [facility]; plan to keep appt on 12-26.   4. All questions answered; patient stable for discharge.   Charlesetta GaribaldiKathryn Lorraine Kohan Azizi 05/19/2018, 8:27 AM

## 2018-05-19 NOTE — Discharge Instructions (Signed)
Labor Induction    Labor induction is when steps are taken to cause a pregnant woman to begin the labor process. Most women go into labor on their own between 37 weeks and 42 weeks of pregnancy. When this does not happen or when there is a medical need for labor to begin, steps may be taken to induce labor. Labor induction causes a pregnant woman's uterus to contract. It also causes the cervix to soften (ripen), open (dilate), and thin out (efface). Usually, labor is not induced before 39 weeks of pregnancy unless there is a medical reason to do so. Your health care provider will determine if labor induction is needed.  Before inducing labor, your health care provider will consider a number of factors, including:  · Your medical condition and your baby's.  · How many weeks along you are in your pregnancy.  · How mature your baby's lungs are.  · The condition of your cervix.  · The position of your baby.  · The size of your birth canal.  What are some reasons for labor induction?  Labor may be induced if:  · Your health or your baby's health is at risk.  · Your pregnancy is overdue by 1 week or more.  · Your water breaks but labor does not start on its own.  · There is a low amount of amniotic fluid around your baby.  You may also choose (elect) to have labor induced at a certain time. Generally, elective labor induction is done no earlier than 39 weeks of pregnancy.  What methods are used for labor induction?  Methods used for labor induction include:  · Prostaglandin medicine. This medicine starts contractions and causes the cervix to dilate and ripen. It can be taken by mouth (orally) or by being inserted into the vagina (suppository).  · Inserting a small, thin tube (catheter) with a balloon into the vagina and then expanding the balloon with water to dilate the cervix.  · Stripping the membranes. In this method, your health care provider gently separates amniotic sac tissue from the cervix. This causes the  cervix to stretch, which in turn causes the release of a hormone called progesterone. The hormone causes the uterus to contract. This procedure is often done during an office visit, after which you will be sent home to wait for contractions to begin.  · Breaking the water. In this method, your health care provider uses a small instrument to make a small hole in the amniotic sac. This eventually causes the amniotic sac to break. Contractions should begin after a few hours.  · Medicine to trigger or strengthen contractions. This medicine is given through an IV that is inserted into a vein in your arm.  Except for membrane stripping, which can be done in a clinic, labor induction is done in the hospital so that you and your baby can be carefully monitored.  How long does it take for labor to be induced?  The length of time it takes to induce labor depends on how ready your body is for labor. Some inductions can take up to 2-3 days, while others may take less than a day. Induction may take longer if:  · You are induced early in your pregnancy.  · It is your first pregnancy.  · Your cervix is not ready.  What are some risks associated with labor induction?  Some risks associated with labor induction include:  · Changes in fetal heart rate, such as being too   high, too low, or irregular (erratic).  · Failed induction.  · Infection in the mother or the baby.  · Increased risk of having a cesarean delivery.  · Fetal death.  · Breaking off (abruption) of the placenta from the uterus (rare).  · Rupture of the uterus (very rare).  When induction is needed for medical reasons, the benefits of induction generally outweigh the risks.  What are some reasons for not inducing labor?  Labor induction should not be done if:  · Your baby does not tolerate contractions.  · You have had previous surgeries on your uterus, such as a myomectomy, removal of fibroids, or a vertical scar from a previous cesarean delivery.  · Your placenta lies  very low in your uterus and blocks the opening of the cervix (placenta previa).  · Your baby is not in a head-down position.  · The umbilical cord drops down into the birth canal in front of the baby.  · There are unusual circumstances, such as the baby being very early (premature).  · You have had more than 2 previous cesarean deliveries.  Summary  · Labor induction is when steps are taken to cause a pregnant woman to begin the labor process.  · Labor induction causes a pregnant woman's uterus to contract. It also causes the cervix to ripen, dilate, and efface.  · Labor is not induced before 39 weeks of pregnancy unless there is a medical reason to do so.  · When induction is needed for medical reasons, the benefits of induction generally outweigh the risks.  This information is not intended to replace advice given to you by your health care provider. Make sure you discuss any questions you have with your health care provider.  Document Released: 10/02/2006 Document Revised: 06/26/2016 Document Reviewed: 06/26/2016  Elsevier Interactive Patient Education © 2019 [employer]

## 2018-05-20 ENCOUNTER — Other Ambulatory Visit: Payer: Self-pay

## 2018-05-20 ENCOUNTER — Encounter (HOSPITAL_COMMUNITY): Payer: Self-pay

## 2018-05-20 LAB — ABO/RH: ABO/RH(D): O POS

## 2018-05-20 MED ORDER — PHENYLEPHRINE 40 MCG/ML (10ML) SYRINGE FOR IV PUSH (FOR BLOOD PRESSURE SUPPORT)
80.0000 ug | PREFILLED_SYRINGE | INTRAVENOUS | Status: DC | PRN
  Filled 2018-05-20: qty 10

## 2018-05-20 MED ORDER — LACTATED RINGERS IV SOLN: 500.0000 mL | Freq: Once | INTRAVENOUS | Status: DC

## 2018-05-20 MED ORDER — ONDANSETRON HCL 4 MG/2ML IJ SOLN: 4.0000 mg | INTRAMUSCULAR | Status: DC | PRN

## 2018-05-20 MED ORDER — IBUPROFEN 600 MG PO TABS
600.0000 mg | ORAL_TABLET | Freq: Four times a day (QID) | ORAL | Status: DC
  Administered 2018-05-20 – 2018-05-22 (×8): 600 mg via ORAL
  Filled 2018-05-20 (×8): qty 1

## 2018-05-20 MED ORDER — TETANUS-DIPHTH-ACELL PERTUSSIS 5-2.5-18.5 LF-MCG/0.5 IM SUSP: 0.5000 mL | Freq: Once | INTRAMUSCULAR | Status: DC

## 2018-05-20 MED ORDER — TERBUTALINE SULFATE 1 MG/ML IJ SOLN: 0.2500 mg | Freq: Once | INTRAMUSCULAR | Status: DC | PRN

## 2018-05-20 MED ORDER — EPHEDRINE 5 MG/ML INJ
10.0000 mg | INTRAVENOUS | Status: DC | PRN
  Filled 2018-05-20: qty 2

## 2018-05-20 MED ORDER — OXYTOCIN 40 UNITS IN LACTATED RINGERS INFUSION - SIMPLE MED: 1.0000 m[IU]/min | INTRAVENOUS | Status: DC

## 2018-05-20 MED ORDER — TERBUTALINE SULFATE 1 MG/ML IJ SOLN
0.2500 mg | Freq: Once | INTRAMUSCULAR | Status: DC | PRN
  Filled 2018-05-20: qty 1

## 2018-05-20 MED ORDER — OXYTOCIN 40 UNITS IN LACTATED RINGERS INFUSION - SIMPLE MED
1.0000 m[IU]/min | INTRAVENOUS | Status: DC
  Filled 2018-05-20: qty 1000

## 2018-05-20 MED ORDER — METHYLERGONOVINE MALEATE 0.2 MG PO TABS: 0.2000 mg | ORAL_TABLET | ORAL | Status: DC | PRN

## 2018-05-20 MED ORDER — OXYTOCIN 40 UNITS IN LACTATED RINGERS INFUSION - SIMPLE MED
1.0000 m[IU]/min | INTRAVENOUS | Status: DC
  Administered 2018-05-20: 6 m[IU]/min via INTRAVENOUS
  Administered 2018-05-20: 3 m[IU]/min via INTRAVENOUS
  Administered 2018-05-20: 4 m[IU]/min via INTRAVENOUS
  Administered 2018-05-20: 1 m[IU]/min via INTRAVENOUS

## 2018-05-20 MED ORDER — ZOLPIDEM TARTRATE 5 MG PO TABS: 5.0000 mg | ORAL_TABLET | Freq: Every evening | ORAL | Status: DC | PRN

## 2018-05-20 MED ORDER — ACETAMINOPHEN 325 MG PO TABS
650.0000 mg | ORAL_TABLET | ORAL | Status: DC | PRN
  Administered 2018-05-21: 650 mg via ORAL
  Filled 2018-05-20: qty 2

## 2018-05-20 MED ORDER — SIMETHICONE 80 MG PO CHEW: 80.0000 mg | CHEWABLE_TABLET | ORAL | Status: DC | PRN

## 2018-05-20 MED ORDER — COCONUT OIL OIL
1.0000 "application " | TOPICAL_OIL | Status: DC | PRN
  Administered 2018-05-22: 1 via TOPICAL
  Filled 2018-05-20: qty 120

## 2018-05-20 MED ORDER — ONDANSETRON HCL 4 MG PO TABS: 4.0000 mg | ORAL_TABLET | ORAL | Status: DC | PRN

## 2018-05-20 MED ORDER — OXYCODONE-ACETAMINOPHEN 5-325 MG PO TABS: 2.0000 | ORAL_TABLET | ORAL | Status: DC | PRN

## 2018-05-20 MED ORDER — WITCH HAZEL-GLYCERIN EX PADS: 1.0000 "application " | MEDICATED_PAD | CUTANEOUS | Status: DC | PRN

## 2018-05-20 MED ORDER — DIPHENHYDRAMINE HCL 50 MG/ML IJ SOLN: 12.5000 mg | INTRAMUSCULAR | Status: DC | PRN

## 2018-05-20 MED ORDER — PRENATAL MULTIVITAMIN CH
1.0000 | ORAL_TABLET | Freq: Every day | ORAL | Status: DC
  Administered 2018-05-21 – 2018-05-22 (×2): 1 via ORAL
  Filled 2018-05-20 (×2): qty 1

## 2018-05-20 MED ORDER — OXYCODONE-ACETAMINOPHEN 5-325 MG PO TABS: 1.0000 | ORAL_TABLET | ORAL | Status: DC | PRN

## 2018-05-20 MED ORDER — FENTANYL 2.5 MCG/ML BUPIVACAINE 1/10 % EPIDURAL INFUSION (WH - ANES)
14.0000 mL/h | INTRAMUSCULAR | Status: DC | PRN
  Administered 2018-05-20 (×2): 14 mL/h via EPIDURAL
  Filled 2018-05-20 (×2): qty 100

## 2018-05-20 MED ORDER — BENZOCAINE-MENTHOL 20-0.5 % EX AERO
1.0000 "application " | INHALATION_SPRAY | CUTANEOUS | Status: DC | PRN
  Administered 2018-05-20: 1 via TOPICAL
  Filled 2018-05-20: qty 56

## 2018-05-20 MED ORDER — METHYLERGONOVINE MALEATE 0.2 MG/ML IJ SOLN: 0.2000 mg | INTRAMUSCULAR | Status: DC | PRN

## 2018-05-20 MED ORDER — DIBUCAINE 1 % RE OINT: 1.0000 "application " | TOPICAL_OINTMENT | RECTAL | Status: DC | PRN

## 2018-05-20 MED ORDER — FENTANYL 2.5 MCG/ML BUPIVACAINE 1/10 % EPIDURAL INFUSION (WH - ANES)
INTRAMUSCULAR | Status: AC
  Filled 2018-05-20: qty 100

## 2018-05-20 MED ORDER — DIPHENHYDRAMINE HCL 25 MG PO CAPS: 25.0000 mg | ORAL_CAPSULE | Freq: Four times a day (QID) | ORAL | Status: DC | PRN

## 2018-05-20 MED ORDER — SENNOSIDES-DOCUSATE SODIUM 8.6-50 MG PO TABS
2.0000 | ORAL_TABLET | ORAL | Status: DC
  Administered 2018-05-20 – 2018-05-21 (×2): 2 via ORAL
  Filled 2018-05-20 (×2): qty 2

## 2018-05-20 NOTE — Progress Notes (Signed)
Jill Ruiz is a 32 y.o. G1P0 at 3861w6d by LMP admitted for active labor  Subjective: Comfortable with epidural  Objective: BP (!) 108/55   Pulse 93   Temp 98.5 F (36.9 C) (Oral)   Resp 18   Ht 5' 4.5" (1.638 m)   Wt 94.3 kg   SpO2 96%   BMI 35.15 kg/m  No intake/output data recorded. Total I/O In: -  Out: 500 [Urine:500]  FHT:  FHR: 155 bpm, variability: moderate,  accelerations:  Present,  decelerations:  Absent UC:   regular, every 2-3 minutes SVE:   7/100/0 IUPC placed without difficulty  Labs: Lab Results  Component Value Date   WBC 10.8 (H) 05/19/2018   HGB 11.4 (L) 05/19/2018   HCT 35.0 (L) 05/19/2018   MCV 89.1 05/19/2018   PLT 210 05/19/2018    Assessment / Plan: Protracted active phase  LGA  Labor: on Pitocin Preeclampsia:  no signs or symptoms of toxicity Fetal Wellbeing:  Category I Pain Control:  Epidural I/D:  n/a Anticipated MOD:  Guarded.   Cristine Daw J 05/20/2018, 6:34 AM

## 2018-05-21 LAB — CBC
HEMATOCRIT: 28.9 % — AB (ref 36.0–46.0)
Hemoglobin: 9.3 g/dL — ABNORMAL LOW (ref 12.0–15.0)
MCH: 29.1 pg (ref 26.0–34.0)
MCHC: 32.2 g/dL (ref 30.0–36.0)
MCV: 90.3 fL (ref 80.0–100.0)
NRBC: 0 % (ref 0.0–0.2)
Platelets: 157 10*3/uL (ref 150–400)
RBC: 3.2 MIL/uL — ABNORMAL LOW (ref 3.87–5.11)
RDW: 15.2 % (ref 11.5–15.5)
WBC: 14.5 10*3/uL — AB (ref 4.0–10.5)

## 2018-05-21 LAB — RPR: RPR Ser Ql: NONREACTIVE

## 2018-05-21 LAB — GC/CHLAMYDIA PROBE AMP (~~LOC~~) NOT AT ARMC
Chlamydia: NEGATIVE
Neisseria Gonorrhea: NEGATIVE

## 2018-05-21 NOTE — Lactation Note (Signed)
This note was copied from a baby's chart. Lactation Consultation Note  Patient Name: Jill Ruiz [id number]'s Date: 05/21/2018 Reason for consult: Follow-up assessment;1st time breastfeeding;Term;Other (Comment)(Infertility (IUI))   Follow up with mom of 26 hour old infant. Infant with 2 BF for 13-15 minutes, 6 BF attempts, EBM x 3 of 0.5-2 cc, 4 voids, 4 stools and 5 episodes of emesis. Infant asleep in dad's arms. Infant weight 8 pounds 2.2 ounces with 2% weight loss since birth. LATCH scores 3-6.   Mom reports infant is not latching and feeding despite attempts. Mom has cone shaped breasts and flat nipples with compressible areola and nipples evert with stimulation. Mom reports + breast growth with pregnancy.  Infant was able to latch to breast with and without the NS and would take a few suckles and hold the nipple in her mouth. Infant would open her eyes and look around but not suckle. Stimulation did not assist infant to suckle. Tried hand expressing and spoon feeding with not interest in feeding. Infant gaggy and spitty. Mom is concerned infant not feeding well, mom asked about formula supplementation. Given that infant not interested in feeding and would not suckle on gloved finger LC feels that infant does not seem ready to feed at this time. Enc mom to continue trying to BF and if infant is not latching and seeming hungry, and if EBM is not expressible this evening or tonight,  then formula may need to be added. Mom to let the nurses know if they want formula. LEAD teaching reviewed with mom.   Worked with mom on hand expression. Reviewed with mom that die to infertility and now infant over 24 hours I would recommend that mom pump about every 3 hours after BF or attempt and follow with hand expression. Enc mom to feed all EBM to infant on finger or spoon.   Report to Hermelinda MedicusStephanie Osborne, RN. Mom to call out for feeding assistance as needed. Mom aware of how to apply and use the NS. Dad was  shown how to assist mom with the teacup hold to latch infant. Parents report all questions have been answered at this time.   Mom tired, enc her to nap as most likely infant will be cluster feeding this afternoon or evening into tonight. Enc mom to allow infant to feed on demand.    Maternal Data Formula Feeding for Exclusion: No Has patient been taught Hand Expression?: Yes Does the patient have breastfeeding experience prior to this delivery?: No  Feeding Feeding Type: Breast Fed  LATCH Score Latch: Too sleepy or reluctant, no latch achieved, no sucking elicited.  Audible Swallowing: None  Type of Nipple: Flat  Comfort (Breast/Nipple): Soft / non-tender  Hold (Positioning): Assistance needed to correctly position infant at breast and maintain latch.  LATCH Score: 4  Interventions Interventions: Breast feeding basics reviewed;Assisted with latch;Skin to skin;Breast compression;Hand express;Breast massage;Expressed milk;Position options;Support pillows  Lactation Tools Discussed/Used Tools: Nipple [facility]Shield[facility]: No Pump Review: Setup, frequency, and cleaning;Milk Storage Initiated by:: Reviewed and encouraged every 3 hours post BF   Consult Status Consult Status: Follow-up Date: 05/22/18 Follow-up type: In-patient    Silas FloodSharon S Hice 05/21/2018, 3:18 PM

## 2018-05-21 NOTE — Lactation Note (Signed)
This note was copied from a baby's chart. Lactation Consultation Note  Patient Name: Jill Ruiz ZOXWR'UToday's Date: 05/21/2018 Reason for consult: Initial assessment;1st time breastfeeding;Term P1, 11 hour female infant. BF concerns: Infant with emesis (mucous) and reluctant to breastfeed at this time, mom with flat nipples, breast shells and 20 mm NS usage .  Infant reluctant latch at this time having a lot of mucous and spity.  Per parent, infant had one void and one stool. Infant had another stool (meconium) that was changed by nurse [facility] Mom attempted to latch infant on right breast using football hold infant suckle few times but stopped would not sustain latch approximately 3 minutes. . Mom has flat nipples that responses well to stimulation, LC instructed how to wear breast shells and mom knows to wear with bra and not sleep in shells at night,  mom was fitted with 20 mm NS.  Nurse will demonstrate how to use medela DEBP to mom.  Mom hand expressed and 2 ml of colostrum was given to infant by spoon.  LC discussed  I & O. Mom will breastfeed according hunger cues, 8 to 12 times within 24 hours including nights, mom will not exceed 3 hours without BF infant. Mom made aware of O/P services, breastfeeding support groups, community resources, and our phone # for post-discharge questions.  BF plans: 1. Mom will breastfeed according to hunger cues and not exceed 3 hours without breastfeeding infant. 2. Mom will wear breast shells during the day in bra and not sleep in them.  3,.Mom will use DEBP 8 times per day pr 15 minutes  4. Mom will call Nurse or LC if she has any further questions, concerns or need assistance with breastfeeding.  Maternal Data Formula Feeding for Exclusion: No Has patient been taught Hand Expression?: Yes(Mom hand expressed 2 ml of colostrum that was spoon feed by infant.) Does the patient have breastfeeding experience prior to this  delivery?: No  Feeding Feeding Type: Breast Fed  LATCH Score Latch: Repeated attempts needed to sustain latch, nipple held in mouth throughout feeding, stimulation needed to elicit sucking reflex.  Audible Swallowing: A few with stimulation  Type of Nipple: Flat  Comfort (Breast/Nipple): Soft / non-tender  Hold (Positioning): Assistance needed to correctly position infant at breast and maintain latch.  LATCH Score: 6  Interventions Interventions: Breast feeding basics reviewed;Assisted with latch;Skin to skin;Breast compression;Breast massage;Adjust position;Support pillows;Shells;Hand pump  Lactation Tools Discussed/Used WIC Program: No Pump Review: Setup, frequency, and cleaning;Milk Storage;Other (comment) Initiated by:: Danelle Earthlyobin Tamarcus Condie Date initiated:: 05/21/18   Consult Status      Danelle EarthlyRobin Eleazar Kimmey 05/21/2018, 12:20 AM

## 2018-05-21 NOTE — Anesthesia Postprocedure Evaluation (Signed)
Anesthesia Post Note  Patient: Donnetta SimpersMaranda N Cobos  Procedure(s) Performed: AN AD HOC LABOR EPIDURAL     Patient location during evaluation: Mother Baby Anesthesia Type: Epidural Level of consciousness: awake and alert Pain management: pain level controlled Vital Signs Assessment: post-procedure vital signs reviewed and stable Respiratory status: spontaneous breathing, nonlabored ventilation and respiratory function stable Cardiovascular status: stable Postop Assessment: no headache, no backache, epidural receding and patient able to bend at knees Anesthetic complications: no    Last Vitals:  Vitals:   05/20/18 2318 05/21/18 0516  BP: 117/68 113/69  Pulse: 93 99  Resp:  20  Temp: 37.2 C 36.6 C  SpO2:  99%    Last Pain:  Vitals:   05/21/18 0518  TempSrc:   PainSc: 0-No pain   Pain Goal:                 Rica RecordsICKELTON,Arnette Driggs

## 2018-05-21 NOTE — Progress Notes (Signed)
Post Partum Day 1, SVD, 2nd degree lac. Girl  Subjective: no complaints, up ad lib, voiding, tolerating PO and + flatus  Objective: Blood pressure 113/69, pulse 99, temperature 97.9 F (36.6 C), temperature source Oral, resp. rate 20, height 5' 4.5" (1.638 m), weight 94.3 kg, SpO2 99 %, unknown if currently breastfeeding.  Physical Exam:  General: alert and cooperative Lochia: appropriate Uterine Fundus: firm Incision: no concerns DVT Evaluation: No evidence of DVT seen on physical exam.  Recent Labs    05/19/18 2250 05/21/18 0549  HGB 11.4* 9.3*  HCT 35.0* 28.9*   O(+) Rub Immune   Assessment/Plan: Plan for discharge tomorrow, Breastfeeding and Lactation consult  Routine PP care Peri-care.   Robley FriesVaishali R Yacqub Baston 05/21/2018, 12:30 PM

## 2018-05-22 ENCOUNTER — Inpatient Hospital Stay (HOSPITAL_COMMUNITY): Admission: RE | Admit: 2018-05-22 | Payer: BLUE CROSS/BLUE SHIELD | Source: Ambulatory Visit

## 2018-05-22 DIAGNOSIS — D62 Acute posthemorrhagic anemia: Secondary | ICD-10-CM

## 2018-05-22 MED ORDER — IBUPROFEN 600 MG PO TABS: 600.0000 mg | ORAL_TABLET | Freq: Four times a day (QID) | ORAL | 0 refills | Status: DC

## 2018-05-22 NOTE — Progress Notes (Signed)
[name] was referred for history of depression/anxiety. * Referral screened out by Clinical Social Worker because none of the following criteria appear to apply: ~ History of anxiety/depression during this pregnancy, or of post-partum depression following prior delivery. ~ Diagnosis of anxiety and/or depression within last 3 years OR * [name]'s symptoms currently being treated with medication and/or therapy. Please contact the Clinical Social Worker if needs arise, by [name] request, or if [name] scores greater than 9/yes to question 10 on Edinburgh Postpartum Depression Screen.  Carsen Machi, LCSW Clinical Social Worker  [employer]  [phone]  

## 2018-05-22 NOTE — Lactation Note (Signed)
This note was copied from a baby's chart. Lactation Consultation Note  Patient Name: Girl Emelia LoronMaranda Bara ZOXWR'UToday's Date: 05/22/2018 Reason for consult: Follow-up assessment;Term P1, 35 hour female infant . Mom is feeling more confident with breastfeeding/ Per  Mom, infant started to latch better today around 6 pm. Mom has cone shaped flat nipples. LC had mom hand express prior to latching infant to breast. Mom latched infant in football hold on left breast. Infant open mouth wide, audible swallows heard by Mom and LC. Infant breastfeeding for 10 minutes and was still breastfeeding as LC left there room. Mom has not been wearing breast shells as advised per mom, she did not put on her bra so she has not worn them.  Mom will call Nurse or LC if she has further questions, concerns or need assistance with latching infant to breast. Mom continue breastfeed according hunger cues and not exceed 3 hours without breastfeeding infant.   Maternal Data    Feeding Feeding Type: Breast Fed  LATCH Score Latch: Grasps breast easily, tongue down, lips flanged, rhythmical sucking.  Audible Swallowing: Spontaneous and intermittent  Type of Nipple: Everted at rest and after stimulation  Comfort (Breast/Nipple): Soft / non-tender  Hold (Positioning): Assistance needed to correctly position infant at breast and maintain latch.  LATCH Score: 9  Interventions Interventions: Assisted with latch;Breast compression;Adjust position;Support pillows;Position options  Lactation Tools Discussed/Used Tools: Nipple Shields Nipple shield size: 20   Consult Status Consult Status: Follow-up Date: 05/22/18 Follow-up type: In-patient    Danelle EarthlyRobin Gildardo Tickner 05/22/2018, 12:21 AM

## 2018-05-22 NOTE — Discharge Summary (Signed)
Obstetric Discharge Summary   Patient Name: Jill Ruiz DOB: 06/24/1985 MRN: [id number]  Date of Admission: 05/19/2018 Date of Discharge: 05/22/2018 Date of Delivery: 05/20/18 Gestational Age at Delivery: 8029w6d  [facility] Dr. Billy Coast[facility]av[facility]  Antepartum complications:  - infertility  - LGA - Depression on Zoloft 25mg  daily Prenatal Labs:  ABO, Rh: O/Positive/-- (05/28 0000) Antibody: Negative (05/28 0000) Rubella: Immune (05/28 0000) RPR: Nonreactive (05/28 0000)  HBsAg: Negative (05/28 0000)  HIV: Non-reactive (05/28 0000)  GBS: Negative (11/27 0000)  Admitting Diagnosis: Labor at term   Secondary Diagnoses: Patient Active Problem List   Diagnosis Date Noted  . SVD (spontaneous vaginal delivery) 05/22/2018  . Postpartum care following vaginal delivery (12/25) 05/22/2018  . Second degree perineal laceration 05/22/2018  . Acute blood loss anemia 05/22/2018  . Term pregnancy 05/19/2018    Augmentation: AROM, Pitocin  Complications: None  Date of Delivery: 05/20/18 Delivered By: Dr. Billy Coastaavon Delivery Type: spontaneous vaginal delivery Anesthesia: epidural Placenta: sponatneous Laceration: 2nd degree Episiotomy: none  Newborn Data: Live born female  Birth Weight: 8 lb 5 oz (3771 g) APGAR: 8, 9  Newborn Delivery   Birth date/time:  05/20/2018 12:54:00 Delivery type:  Vaginal, Spontaneous        Hospital/Postpartum Course  (Vaginal Delivery): Pt. Admitted for early labor at term. See notes and delivery summary for details. Patient had an uncomplicated postpartum course.  By time of discharge on PPD#2, her pain was controlled on oral pain medications; she had appropriate lochia and was ambulating, voiding without difficulty and tolerating regular diet.  She was deemed stable for discharge to home.     Labs: CBC Latest Ref Rng & Units 05/21/2018 05/19/2018  WBC 4.0 - 10.5 K/uL 14.5(H) 10.8(H)  Hemoglobin 12.0 - 15.0 g/dL 7.8(G9.3(L) 11.4(L)   Hematocrit 36.0 - 46.0 % 28.9(L) 35.0(L)  Platelets 150 - 400 K/uL 157 210   Conflict (See Lab Report): O POS/O POS Performed at [facility], [address] [place], [place] [address]   Physical exam:  BP (!) 97/55 (BP Location: Left Arm) Comment: map 68, asymptomatic  Pulse 81   Temp 97.9 F (36.6 C) (Oral)   Resp 18   Ht 5' 4.5" (1.638 m)   Wt 94.3 kg   SpO2 98%   Breastfeeding Unknown   BMI 35.15 kg/m  General: alert and no distress Pulm: normal respiratory effort Lochia: appropriate Abdomen: soft, NT Uterine Fundus: firm, below umbilicus Perineum: healing well, no significant erythema, no significant edema Extremities: No evidence of DVT seen on physical exam. No lower extremity edema.   Disposition: stable, discharge to home Baby Feeding: breast milk and formula Baby Disposition: home with mom with weight/bili are stable; if not, patient to room in with baby   Contraception: not discussed  Rh Immune globulin given: N/A Rubella vaccine given: N/A Tdap vaccine given in AP or PP setting: UTD Flu vaccine given in AP or PP setting: UTD   Plan:  Jill Ruiz was discharged to home in good condition. [facility] weeks.  Discharge Instructions: Per After Visit Summary. Refer to After Visit Summary and [facility] discharge booklet  Activity: Advance as tolerated. Pelvic rest for 6 weeks.   Diet: Regular, Heart Healthy Discharge Medications: Allergies as of 05/22/2018   No Known Allergies     Medication List    TAKE these medications   ibuprofen 600 MG tablet Commonly known as:  ADVIL,MOTRIN Take 1 tablet (600 mg  total) by mouth every 6 (six) hours.   prenatal multivitamin Tabs tablet Take 1 tablet by mouth daily at 12 noon.   sertraline 25 MG tablet Commonly known as:  ZOLOFT Take 25 mg by mouth daily.            Discharge Care Instructions  (From admission, onward)         Start     Ordered    05/22/18 0000  Discharge wound care:    Comments:  Warm water sitz baths 2-3 times per day   05/22/18 1150         Outpatient follow up:  Follow-up Information    Olivia Mackieaavon, Richard, MD. Schedule an appointment as soon as possible for a visit in 6 week(s).   Specialty:  Obstetrics and Gynecology Why:  Postpartum visit  Contact information: [address] [place] [address] [phone]           Signed:  Carlean JewsMeredith Marinus Eicher, MSN, CNM [facility]

## 2018-05-22 NOTE — Progress Notes (Signed)
PPD #2, SVD, 2nd degree repair, baby girl "August"  S:  Reports feeling better today; has slept             Tolerating po/ No nausea or vomiting / Denies dizziness or SOB             Bleeding is light             Pain controlled with Motrin             Up ad lib / ambulatory / voiding QS  Newborn breast feeding and formula feeding; states feeling better with feedings   O:               VS: BP (!) 97/55 (BP Location: Left Arm) Comment: map 68, asymptomatic  Pulse 81   Temp 97.9 F (36.6 C) (Oral)   Resp 18   Ht 5' 4.5" (1.638 m)   Wt 94.3 kg   SpO2 98%   Breastfeeding Unknown   BMI 35.15 kg/m    LABS:              Recent Labs    05/19/18 2250 05/21/18 0549  WBC 10.8* 14.5*  HGB 11.4* 9.3*  PLT 210 157               Blood type: --/--/O POS, O POS Performed at [facility], [address] [place], [place] [address]  [phone](12/24 2250)  Rubella: Immune (05/28 0000)                     I&O: Intake/Output      12/26 0701 - 12/27 0700 12/27 0701 - 12/28 0700   Urine (mL/kg/hr)     Blood     Total Output     Net                        Physical Exam:             Alert and oriented X3  Lungs: Clear and unlabored  Heart: regular rate and rhythm / no murmurs  Abdomen: soft, non-tender, non-distended              Fundus: firm, non-tender, U-2  Perineum: well approximated 2nd degree repair, no edema, no erythema, no ecchymosis  Lochia: small, no clots   Extremities: no edema, no calf pain or tenderness    A: PPD # 2, SVD  2nd degree repair  ABL Anemia - stable, asymptomatic    Depression - on Zoloft 25mg  daily  Doing well - stable status  P: Routine post partum orders  Continue home oral FE daily x 6 weeks   Continue Zoloft daily - PPD s/s and warning s/s reviewed   Discharge home today  [facility] discharge book, instructions, and warning s/s reviewed  Outpatient lactation services discussed   F/u with Dr. Billy Coastaavon in 6 weeks     Carlean JewsMeredith Sigmon, MSN, CNM [facility]

## 2018-05-22 NOTE — Lactation Note (Signed)
This note was copied from a baby's chart. Lactation Consultation Note  Patient Name: Jill Ruiz VHQIO'NToday's Date: 05/22/2018 Reason for consult: Follow-up assessment  P1 mother whose infant is now 1046 hours old.  Baby was sleeping in mother's lap when I arrived.    Mother's breasts are soft and non tender and nipples are flat.  Mother Is wearing  breast shells and has a manual pump at bedside.  Reviewed instructions for use with both of these items.  Encouraged feeding 8-12 times/24 hours or sooner if baby shows cues.  Mother will continue to do hand expression before/after feedings and will pump after feedings.  She will feed back any EBM she obtains from pumping or hand expression.    Engorgement prevention/treatment discussed.  Mother has a DEBP for home use and will return to work in 6-8 weeks.  Baby will stay with grandmother.  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  She has our OP phone number for questions after discharge.  Suggested an OP consult if needed and how to make an appointment.    Father present and very supportive.  Both parents receptive and eager to learn.     Maternal Data Formula Feeding for Exclusion: No Has patient been taught Hand Expression?: Yes Does the patient have breastfeeding experience prior to this delivery?: No  Feeding Feeding Type: (enc mother to do STS)  LATCH Score                   Interventions    Lactation Tools Discussed/Used WIC Program: No   Consult Status Consult Status: Complete Date: 05/22/18 Follow-up type: Call as needed    Jill Ruiz 05/22/2018, 11:20 AM

## 2018-07-01 DIAGNOSIS — Z124 Encounter for screening for malignant neoplasm of cervix: Secondary | ICD-10-CM | POA: Diagnosis not present

## 2019-01-19 DIAGNOSIS — Z3689 Encounter for other specified antenatal screening: Secondary | ICD-10-CM | POA: Diagnosis not present

## 2019-01-19 DIAGNOSIS — Z3687 Encounter for antenatal screening for uncertain dates: Secondary | ICD-10-CM | POA: Diagnosis not present

## 2019-01-19 DIAGNOSIS — Z3200 Encounter for pregnancy test, result unknown: Secondary | ICD-10-CM | POA: Diagnosis not present

## 2019-01-21 DIAGNOSIS — Z3687 Encounter for antenatal screening for uncertain dates: Secondary | ICD-10-CM | POA: Diagnosis not present

## 2019-01-25 DIAGNOSIS — Z3A01 Less than 8 weeks gestation of pregnancy: Secondary | ICD-10-CM | POA: Diagnosis not present

## 2019-01-25 DIAGNOSIS — O209 Hemorrhage in early pregnancy, unspecified: Secondary | ICD-10-CM | POA: Diagnosis not present

## 2019-01-27 DIAGNOSIS — Z3A01 Less than 8 weeks gestation of pregnancy: Secondary | ICD-10-CM | POA: Diagnosis not present

## 2019-01-27 DIAGNOSIS — O209 Hemorrhage in early pregnancy, unspecified: Secondary | ICD-10-CM | POA: Diagnosis not present

## 2019-02-03 DIAGNOSIS — Z3A01 Less than 8 weeks gestation of pregnancy: Secondary | ICD-10-CM | POA: Diagnosis not present

## 2019-02-03 DIAGNOSIS — O209 Hemorrhage in early pregnancy, unspecified: Secondary | ICD-10-CM | POA: Diagnosis not present

## 2019-02-06 DIAGNOSIS — J069 Acute upper respiratory infection, unspecified: Secondary | ICD-10-CM | POA: Diagnosis not present

## 2019-02-11 DIAGNOSIS — O039 Complete or unspecified spontaneous abortion without complication: Secondary | ICD-10-CM | POA: Diagnosis not present

## 2019-02-19 DIAGNOSIS — O039 Complete or unspecified spontaneous abortion without complication: Secondary | ICD-10-CM | POA: Diagnosis not present

## 2019-04-28 DIAGNOSIS — Z3A01 Less than 8 weeks gestation of pregnancy: Secondary | ICD-10-CM | POA: Diagnosis not present

## 2019-04-28 DIAGNOSIS — Z3200 Encounter for pregnancy test, result unknown: Secondary | ICD-10-CM | POA: Diagnosis not present

## 2019-04-28 DIAGNOSIS — Z3689 Encounter for other specified antenatal screening: Secondary | ICD-10-CM | POA: Diagnosis not present

## 2019-04-28 DIAGNOSIS — O09291 Supervision of pregnancy with other poor reproductive or obstetric history, first trimester: Secondary | ICD-10-CM | POA: Diagnosis not present

## 2019-04-30 DIAGNOSIS — Z3A19 19 weeks gestation of pregnancy: Secondary | ICD-10-CM | POA: Diagnosis not present

## 2019-04-30 DIAGNOSIS — O09299 Supervision of pregnancy with other poor reproductive or obstetric history, unspecified trimester: Secondary | ICD-10-CM | POA: Diagnosis not present

## 2019-05-04 DIAGNOSIS — O09299 Supervision of pregnancy with other poor reproductive or obstetric history, unspecified trimester: Secondary | ICD-10-CM | POA: Diagnosis not present

## 2019-05-04 DIAGNOSIS — Z3A01 Less than 8 weeks gestation of pregnancy: Secondary | ICD-10-CM | POA: Diagnosis not present

## 2019-05-14 DIAGNOSIS — Z3201 Encounter for pregnancy test, result positive: Secondary | ICD-10-CM | POA: Diagnosis not present

## 2019-05-28 NOTE — L&D Delivery Note (Signed)
Delivery Note At 10:11 AM a viable and healthy female was delivered via Vaginal, Spontaneous (Presentation: LOA ).  APGAR: 9, 9; weight  pending.   Placenta status:  spontaneous, intact .  Cord: 3 vessels with the following complications: None.  Cord pH: na Roscoe x one reduced Anesthesia: Epidural Episiotomy:  na Lacerations:  second Suture Repair: 2.0 vicryl rapide Est. Blood Loss (mL):  100  Mom to postpartum.  Baby to Couplet care / Skin to Skin.  Finnbar Cedillos J 12/30/2019, 11:14 AM

## 2019-06-03 DIAGNOSIS — Z3A09 9 weeks gestation of pregnancy: Secondary | ICD-10-CM | POA: Diagnosis not present

## 2019-06-03 DIAGNOSIS — O09291 Supervision of pregnancy with other poor reproductive or obstetric history, first trimester: Secondary | ICD-10-CM | POA: Diagnosis not present

## 2019-06-03 DIAGNOSIS — O09521 Supervision of elderly multigravida, first trimester: Secondary | ICD-10-CM | POA: Diagnosis not present

## 2019-06-03 DIAGNOSIS — Z3689 Encounter for other specified antenatal screening: Secondary | ICD-10-CM | POA: Diagnosis not present

## 2019-06-03 LAB — OB RESULTS CONSOLE ABO/RH: RH Type: POSITIVE

## 2019-06-03 LAB — OB RESULTS CONSOLE RUBELLA ANTIBODY, IGM: Rubella: IMMUNE

## 2019-06-03 LAB — OB RESULTS CONSOLE HIV ANTIBODY (ROUTINE TESTING): HIV: NONREACTIVE

## 2019-06-03 LAB — OB RESULTS CONSOLE RPR: RPR: NONREACTIVE

## 2019-06-03 LAB — OB RESULTS CONSOLE HEPATITIS B SURFACE ANTIGEN: Hepatitis B Surface Ag: NEGATIVE

## 2019-06-03 LAB — OB RESULTS CONSOLE ANTIBODY SCREEN: Antibody Screen: NEGATIVE

## 2019-06-11 DIAGNOSIS — O09521 Supervision of elderly multigravida, first trimester: Secondary | ICD-10-CM | POA: Diagnosis not present

## 2019-06-11 DIAGNOSIS — Z3A10 10 weeks gestation of pregnancy: Secondary | ICD-10-CM | POA: Diagnosis not present

## 2019-06-11 DIAGNOSIS — O09291 Supervision of pregnancy with other poor reproductive or obstetric history, first trimester: Secondary | ICD-10-CM | POA: Diagnosis not present

## 2019-06-11 DIAGNOSIS — Z3689 Encounter for other specified antenatal screening: Secondary | ICD-10-CM | POA: Diagnosis not present

## 2019-06-30 DIAGNOSIS — O09521 Supervision of elderly multigravida, first trimester: Secondary | ICD-10-CM | POA: Diagnosis not present

## 2019-06-30 DIAGNOSIS — Z3A13 13 weeks gestation of pregnancy: Secondary | ICD-10-CM | POA: Diagnosis not present

## 2019-07-21 DIAGNOSIS — Z3A16 16 weeks gestation of pregnancy: Secondary | ICD-10-CM | POA: Diagnosis not present

## 2019-07-21 DIAGNOSIS — O09522 Supervision of elderly multigravida, second trimester: Secondary | ICD-10-CM | POA: Diagnosis not present

## 2019-07-21 DIAGNOSIS — Z361 Encounter for antenatal screening for raised alphafetoprotein level: Secondary | ICD-10-CM | POA: Diagnosis not present

## 2019-08-11 DIAGNOSIS — O09522 Supervision of elderly multigravida, second trimester: Secondary | ICD-10-CM | POA: Diagnosis not present

## 2019-08-11 DIAGNOSIS — Z3A19 19 weeks gestation of pregnancy: Secondary | ICD-10-CM | POA: Diagnosis not present

## 2019-08-31 DIAGNOSIS — Z362 Encounter for other antenatal screening follow-up: Secondary | ICD-10-CM | POA: Diagnosis not present

## 2019-08-31 DIAGNOSIS — Z3A22 22 weeks gestation of pregnancy: Secondary | ICD-10-CM | POA: Diagnosis not present

## 2019-08-31 DIAGNOSIS — O09522 Supervision of elderly multigravida, second trimester: Secondary | ICD-10-CM | POA: Diagnosis not present

## 2019-09-29 DIAGNOSIS — O09522 Supervision of elderly multigravida, second trimester: Secondary | ICD-10-CM | POA: Diagnosis not present

## 2019-09-29 DIAGNOSIS — Z3689 Encounter for other specified antenatal screening: Secondary | ICD-10-CM | POA: Diagnosis not present

## 2019-09-29 DIAGNOSIS — Z3A26 26 weeks gestation of pregnancy: Secondary | ICD-10-CM | POA: Diagnosis not present

## 2019-10-14 DIAGNOSIS — Z3689 Encounter for other specified antenatal screening: Secondary | ICD-10-CM | POA: Diagnosis not present

## 2019-10-14 DIAGNOSIS — O09522 Supervision of elderly multigravida, second trimester: Secondary | ICD-10-CM | POA: Diagnosis not present

## 2019-10-14 DIAGNOSIS — Z3A28 28 weeks gestation of pregnancy: Secondary | ICD-10-CM | POA: Diagnosis not present

## 2019-11-02 DIAGNOSIS — Z3A31 31 weeks gestation of pregnancy: Secondary | ICD-10-CM | POA: Diagnosis not present

## 2019-11-02 DIAGNOSIS — Z23 Encounter for immunization: Secondary | ICD-10-CM | POA: Diagnosis not present

## 2019-11-02 DIAGNOSIS — O09522 Supervision of elderly multigravida, second trimester: Secondary | ICD-10-CM | POA: Diagnosis not present

## 2019-11-23 DIAGNOSIS — O09523 Supervision of elderly multigravida, third trimester: Secondary | ICD-10-CM | POA: Diagnosis not present

## 2019-11-23 DIAGNOSIS — Z3A34 34 weeks gestation of pregnancy: Secondary | ICD-10-CM | POA: Diagnosis not present

## 2019-12-02 DIAGNOSIS — Z3685 Encounter for antenatal screening for Streptococcus B: Secondary | ICD-10-CM | POA: Diagnosis not present

## 2019-12-02 DIAGNOSIS — O09523 Supervision of elderly multigravida, third trimester: Secondary | ICD-10-CM | POA: Diagnosis not present

## 2019-12-02 DIAGNOSIS — Z3A35 35 weeks gestation of pregnancy: Secondary | ICD-10-CM | POA: Diagnosis not present

## 2019-12-02 LAB — OB RESULTS CONSOLE GBS: GBS: NEGATIVE

## 2019-12-09 DIAGNOSIS — Z3A36 36 weeks gestation of pregnancy: Secondary | ICD-10-CM | POA: Diagnosis not present

## 2019-12-09 DIAGNOSIS — O3663X0 Maternal care for excessive fetal growth, third trimester, not applicable or unspecified: Secondary | ICD-10-CM | POA: Diagnosis not present

## 2019-12-17 DIAGNOSIS — Z3A37 37 weeks gestation of pregnancy: Secondary | ICD-10-CM | POA: Diagnosis not present

## 2019-12-17 DIAGNOSIS — O3663X0 Maternal care for excessive fetal growth, third trimester, not applicable or unspecified: Secondary | ICD-10-CM | POA: Diagnosis not present

## 2019-12-20 ENCOUNTER — Telehealth (HOSPITAL_COMMUNITY): Payer: Self-pay | Admitting: *Deleted

## 2019-12-20 ENCOUNTER — Encounter (HOSPITAL_COMMUNITY): Payer: Self-pay | Admitting: *Deleted

## 2019-12-20 NOTE — Telephone Encounter (Signed)
Preadmission screen  

## 2019-12-21 ENCOUNTER — Encounter (HOSPITAL_COMMUNITY): Payer: Self-pay | Admitting: *Deleted

## 2019-12-24 DIAGNOSIS — Z3A38 38 weeks gestation of pregnancy: Secondary | ICD-10-CM | POA: Diagnosis not present

## 2019-12-24 DIAGNOSIS — O09523 Supervision of elderly multigravida, third trimester: Secondary | ICD-10-CM | POA: Diagnosis not present

## 2019-12-24 DIAGNOSIS — O09299 Supervision of pregnancy with other poor reproductive or obstetric history, unspecified trimester: Secondary | ICD-10-CM | POA: Diagnosis not present

## 2019-12-29 ENCOUNTER — Other Ambulatory Visit: Payer: Self-pay | Admitting: Obstetrics and Gynecology

## 2019-12-29 ENCOUNTER — Other Ambulatory Visit (HOSPITAL_COMMUNITY)
Admission: RE | Admit: 2019-12-29 | Discharge: 2019-12-29 | Disposition: A | Discharge status: Home / Self Care | Payer: BC Managed Care – PPO | Source: Ambulatory Visit | Attending: Obstetrics and Gynecology | Admitting: Obstetrics and Gynecology

## 2019-12-29 DIAGNOSIS — O99344 Other mental disorders complicating childbirth: Secondary | ICD-10-CM | POA: Diagnosis not present

## 2019-12-29 DIAGNOSIS — Z01812 Encounter for preprocedural laboratory examination: Secondary | ICD-10-CM | POA: Insufficient documentation

## 2019-12-29 DIAGNOSIS — Z3A39 39 weeks gestation of pregnancy: Secondary | ICD-10-CM | POA: Diagnosis not present

## 2019-12-29 DIAGNOSIS — O3663X0 Maternal care for excessive fetal growth, third trimester, not applicable or unspecified: Secondary | ICD-10-CM | POA: Diagnosis not present

## 2019-12-29 DIAGNOSIS — O26893 Other specified pregnancy related conditions, third trimester: Secondary | ICD-10-CM | POA: Diagnosis not present

## 2019-12-29 DIAGNOSIS — F329 Major depressive disorder, single episode, unspecified: Secondary | ICD-10-CM | POA: Diagnosis not present

## 2019-12-29 DIAGNOSIS — Z87891 Personal history of nicotine dependence: Secondary | ICD-10-CM | POA: Diagnosis not present

## 2019-12-29 DIAGNOSIS — F419 Anxiety disorder, unspecified: Secondary | ICD-10-CM | POA: Diagnosis not present

## 2019-12-29 DIAGNOSIS — Z20822 Contact with and (suspected) exposure to covid-19: Secondary | ICD-10-CM | POA: Diagnosis not present

## 2019-12-29 LAB — SARS CORONAVIRUS 2 (TAT 6-24 HRS): SARS Coronavirus 2: NEGATIVE

## 2019-12-30 ENCOUNTER — Inpatient Hospital Stay (HOSPITAL_COMMUNITY)
Admission: AD | Admit: 2019-12-30 | Discharge: 2020-01-01 | DRG: 807 | Disposition: A | Discharge status: Home / Self Care | Payer: BC Managed Care – PPO | Attending: Obstetrics and Gynecology | Admitting: Obstetrics and Gynecology

## 2019-12-30 ENCOUNTER — Other Ambulatory Visit: Payer: Self-pay

## 2019-12-30 ENCOUNTER — Inpatient Hospital Stay (HOSPITAL_COMMUNITY): Payer: BC Managed Care – PPO | Admitting: Anesthesiology

## 2019-12-30 ENCOUNTER — Encounter (HOSPITAL_COMMUNITY): Payer: Self-pay | Admitting: Obstetrics and Gynecology

## 2019-12-30 DIAGNOSIS — Z87891 Personal history of nicotine dependence: Secondary | ICD-10-CM | POA: Diagnosis not present

## 2019-12-30 DIAGNOSIS — Z20822 Contact with and (suspected) exposure to covid-19: Secondary | ICD-10-CM | POA: Diagnosis present

## 2019-12-30 DIAGNOSIS — Z3A39 39 weeks gestation of pregnancy: Secondary | ICD-10-CM | POA: Diagnosis not present

## 2019-12-30 DIAGNOSIS — O3663X0 Maternal care for excessive fetal growth, third trimester, not applicable or unspecified: Principal | ICD-10-CM | POA: Diagnosis present

## 2019-12-30 DIAGNOSIS — O99344 Other mental disorders complicating childbirth: Secondary | ICD-10-CM | POA: Diagnosis present

## 2019-12-30 DIAGNOSIS — Z3490 Encounter for supervision of normal pregnancy, unspecified, unspecified trimester: Secondary | ICD-10-CM

## 2019-12-30 DIAGNOSIS — F419 Anxiety disorder, unspecified: Secondary | ICD-10-CM | POA: Diagnosis present

## 2019-12-30 DIAGNOSIS — F329 Major depressive disorder, single episode, unspecified: Secondary | ICD-10-CM | POA: Diagnosis present

## 2019-12-30 DIAGNOSIS — O26893 Other specified pregnancy related conditions, third trimester: Secondary | ICD-10-CM | POA: Diagnosis present

## 2019-12-30 LAB — CBC
HCT: 36.9 % (ref 36.0–46.0)
Hemoglobin: 12 g/dL (ref 12.0–15.0)
MCH: 29.6 pg (ref 26.0–34.0)
MCHC: 32.5 g/dL (ref 30.0–36.0)
MCV: 90.9 fL (ref 80.0–100.0)
Platelets: 217 10*3/uL (ref 150–400)
RBC: 4.06 MIL/uL (ref 3.87–5.11)
RDW: 15.7 % — ABNORMAL HIGH (ref 11.5–15.5)
WBC: 10.6 10*3/uL — ABNORMAL HIGH (ref 4.0–10.5)
nRBC: 0 % (ref 0.0–0.2)

## 2019-12-30 LAB — RPR: RPR Ser Ql: NONREACTIVE

## 2019-12-30 LAB — TYPE AND SCREEN
ABO/RH(D): O POS
Antibody Screen: NEGATIVE

## 2019-12-30 MED ORDER — IBUPROFEN 600 MG PO TABS
600.0000 mg | ORAL_TABLET | Freq: Four times a day (QID) | ORAL | Status: DC
  Administered 2019-12-30 – 2020-01-01 (×8): 600 mg via ORAL
  Filled 2019-12-30 (×8): qty 1

## 2019-12-30 MED ORDER — OXYCODONE-ACETAMINOPHEN 5-325 MG PO TABS: 1.0000 | ORAL_TABLET | ORAL | Status: DC | PRN

## 2019-12-30 MED ORDER — ACETAMINOPHEN 325 MG PO TABS
650.0000 mg | ORAL_TABLET | ORAL | Status: DC | PRN
  Filled 2019-12-30: qty 2

## 2019-12-30 MED ORDER — PHENYLEPHRINE 40 MCG/ML (10ML) SYRINGE FOR IV PUSH (FOR BLOOD PRESSURE SUPPORT): 80.0000 ug | PREFILLED_SYRINGE | INTRAVENOUS | Status: DC | PRN

## 2019-12-30 MED ORDER — TERBUTALINE SULFATE 1 MG/ML IJ SOLN: 0.2500 mg | Freq: Once | INTRAMUSCULAR | Status: DC | PRN

## 2019-12-30 MED ORDER — OXYTOCIN-SODIUM CHLORIDE 30-0.9 UT/500ML-% IV SOLN
2.5000 [IU]/h | INTRAVENOUS | Status: DC
  Filled 2019-12-30: qty 500

## 2019-12-30 MED ORDER — LACTATED RINGERS IV SOLN
500.0000 mL | Freq: Once | INTRAVENOUS | Status: AC
  Administered 2019-12-30: 500 mL via INTRAVENOUS

## 2019-12-30 MED ORDER — EPHEDRINE 5 MG/ML INJ: 10.0000 mg | INTRAVENOUS | Status: DC | PRN

## 2019-12-30 MED ORDER — BENZOCAINE-MENTHOL 20-0.5 % EX AERO: 1.0000 "application " | INHALATION_SPRAY | CUTANEOUS | Status: DC | PRN

## 2019-12-30 MED ORDER — LIDOCAINE HCL (PF) 1 % IJ SOLN: 30.0000 mL | INTRAMUSCULAR | Status: DC | PRN

## 2019-12-30 MED ORDER — SENNOSIDES-DOCUSATE SODIUM 8.6-50 MG PO TABS
2.0000 | ORAL_TABLET | ORAL | Status: DC
  Administered 2019-12-31: 2 via ORAL
  Filled 2019-12-30 (×2): qty 2

## 2019-12-30 MED ORDER — OXYCODONE-ACETAMINOPHEN 5-325 MG PO TABS: 2.0000 | ORAL_TABLET | ORAL | Status: DC | PRN

## 2019-12-30 MED ORDER — LACTATED RINGERS IV SOLN: 500.0000 mL | INTRAVENOUS | Status: DC | PRN

## 2019-12-30 MED ORDER — SODIUM CHLORIDE (PF) 0.9 % IJ SOLN
INTRAMUSCULAR | Status: DC | PRN
  Administered 2019-12-30: 12 mL/h via EPIDURAL

## 2019-12-30 MED ORDER — TETANUS-DIPHTH-ACELL PERTUSSIS 5-2.5-18.5 LF-MCG/0.5 IM SUSP: 0.5000 mL | Freq: Once | INTRAMUSCULAR | Status: DC

## 2019-12-30 MED ORDER — ACETAMINOPHEN 325 MG PO TABS: 650.0000 mg | ORAL_TABLET | ORAL | Status: DC | PRN

## 2019-12-30 MED ORDER — DIBUCAINE (PERIANAL) 1 % EX OINT: 1.0000 "application " | TOPICAL_OINTMENT | CUTANEOUS | Status: DC | PRN

## 2019-12-30 MED ORDER — DIPHENHYDRAMINE HCL 25 MG PO CAPS: 25.0000 mg | ORAL_CAPSULE | Freq: Four times a day (QID) | ORAL | Status: DC | PRN

## 2019-12-30 MED ORDER — LACTATED RINGERS IV SOLN: INTRAVENOUS | Status: DC

## 2019-12-30 MED ORDER — METHYLERGONOVINE MALEATE 0.2 MG PO TABS: 0.2000 mg | ORAL_TABLET | ORAL | Status: DC | PRN

## 2019-12-30 MED ORDER — COCONUT OIL OIL: 1.0000 "application " | TOPICAL_OIL | Status: DC | PRN

## 2019-12-30 MED ORDER — FLEET ENEMA 7-19 GM/118ML RE ENEM: 1.0000 | ENEMA | RECTAL | Status: DC | PRN

## 2019-12-30 MED ORDER — FENTANYL-BUPIVACAINE-NACL 0.5-0.125-0.9 MG/250ML-% EP SOLN
12.0000 mL/h | EPIDURAL | Status: DC | PRN
  Filled 2019-12-30: qty 250

## 2019-12-30 MED ORDER — OXYTOCIN-SODIUM CHLORIDE 30-0.9 UT/500ML-% IV SOLN
1.0000 m[IU]/min | INTRAVENOUS | Status: DC
  Administered 2019-12-30: 2 m[IU]/min via INTRAVENOUS

## 2019-12-30 MED ORDER — WITCH HAZEL-GLYCERIN EX PADS: 1.0000 "application " | MEDICATED_PAD | CUTANEOUS | Status: DC | PRN

## 2019-12-30 MED ORDER — ONDANSETRON HCL 4 MG PO TABS: 4.0000 mg | ORAL_TABLET | ORAL | Status: DC | PRN

## 2019-12-30 MED ORDER — SIMETHICONE 80 MG PO CHEW: 80.0000 mg | CHEWABLE_TABLET | ORAL | Status: DC | PRN

## 2019-12-30 MED ORDER — SOD CITRATE-CITRIC ACID 500-334 MG/5ML PO SOLN: 30.0000 mL | ORAL | Status: DC | PRN

## 2019-12-30 MED ORDER — OXYTOCIN BOLUS FROM INFUSION
333.0000 mL | Freq: Once | INTRAVENOUS | Status: AC
  Administered 2019-12-30: 333 mL via INTRAVENOUS

## 2019-12-30 MED ORDER — ONDANSETRON HCL 4 MG/2ML IJ SOLN: 4.0000 mg | INTRAMUSCULAR | Status: DC | PRN

## 2019-12-30 MED ORDER — PRENATAL MULTIVITAMIN CH
1.0000 | ORAL_TABLET | Freq: Every day | ORAL | Status: DC
  Administered 2019-12-31 – 2020-01-01 (×2): 1 via ORAL
  Filled 2019-12-30 (×2): qty 1

## 2019-12-30 MED ORDER — LIDOCAINE HCL (PF) 1 % IJ SOLN
INTRAMUSCULAR | Status: DC | PRN
  Administered 2019-12-30: 5 mL via EPIDURAL

## 2019-12-30 MED ORDER — ONDANSETRON HCL 4 MG/2ML IJ SOLN
4.0000 mg | Freq: Four times a day (QID) | INTRAMUSCULAR | Status: DC | PRN
  Administered 2019-12-30: 4 mg via INTRAVENOUS
  Filled 2019-12-30: qty 2

## 2019-12-30 MED ORDER — DIPHENHYDRAMINE HCL 50 MG/ML IJ SOLN: 12.5000 mg | INTRAMUSCULAR | Status: DC | PRN

## 2019-12-30 MED ORDER — METHYLERGONOVINE MALEATE 0.2 MG/ML IJ SOLN: 0.2000 mg | INTRAMUSCULAR | Status: DC | PRN

## 2019-12-30 MED ORDER — ZOLPIDEM TARTRATE 5 MG PO TABS: 5.0000 mg | ORAL_TABLET | Freq: Every evening | ORAL | Status: DC | PRN

## 2019-12-30 NOTE — Anesthesia Postprocedure Evaluation (Signed)
Anesthesia Post Note  Patient: Jill Ruiz  Procedure(s) Performed: AN AD HOC LABOR EPIDURAL     Patient location during evaluation: Mother Baby Anesthesia Type: Epidural Level of consciousness: awake, awake and alert and oriented Pain management: pain level controlled Vital Signs Assessment: post-procedure vital signs reviewed and stable Respiratory status: spontaneous breathing and respiratory function stable Cardiovascular status: blood pressure returned to baseline Postop Assessment: no headache, epidural receding, patient able to bend at knees, adequate PO intake, no backache, no apparent nausea or vomiting and able to ambulate Anesthetic complications: no   No complications documented.  Last Vitals:  Vitals:   12/30/19 1200 12/30/19 1300  BP: 108/63 (!) 103/59  Pulse: 81 63  Resp: 16 16  Temp:  36.7 C  SpO2:      Last Pain:  Vitals:   12/30/19 1300  TempSrc: Oral  PainSc: 0-No pain   Pain Goal:                   Cleda Clarks

## 2019-12-30 NOTE — MAU Note (Signed)
Pt reports that she started having ctx's around 2230. Pt reports the ctx's are now 5 minutes or less.  Denies vaginal bleeding or LOF.   Reports good fetal movement today, but feels like now she is having decreased fetal movement.

## 2019-12-30 NOTE — Progress Notes (Signed)
Jill Ruiz is a 34 y.o. G3P1011 at [redacted]w[redacted]d by LMP admitted for active labor  Subjective: sleeping  Objective: BP (!) 94/52    Pulse 77    Temp 98.3 F (36.8 C) (Oral)    Resp 16    Ht 5\' 4"  (1.626 m)    Wt 98.2 kg    SpO2 99%    BMI 37.17 kg/m  I/O last 3 completed shifts: In: -  Out: 450 [Urine:450] No intake/output data recorded.  FHT:  FHR: 145 bpm, variability: moderate,  accelerations:  Present,  decelerations:  Absent UC:   irregular, every 1-4 minutes SVE:   7/90/-1 AROM - lt mec  Labs: Lab Results  Component Value Date   WBC 10.6 (H) 12/30/2019   HGB 12.0 12/30/2019   HCT 36.9 12/30/2019   MCV 90.9 12/30/2019   PLT 217 12/30/2019    Assessment / Plan: Protracted active phase  Labor: add pitocin Preeclampsia:  no signs or symptoms of toxicity and labs stable Fetal Wellbeing:  Category I Pain Control:  Epidural I/D:  n/a Anticipated MOD:  NSVD  Jill Ruiz J 12/30/2019, 8:17 AM

## 2019-12-30 NOTE — Anesthesia Preprocedure Evaluation (Signed)
Anesthesia Evaluation  Patient identified by MRN, date of birth, ID band Patient awake    Reviewed: Allergy & Precautions, NPO status , Patient's Chart, lab work & pertinent test results  Airway Mallampati: II  TM Distance: >3 FB Neck ROM: Full    Dental no notable dental hx. (+) Teeth Intact, Dental Advisory Given   Pulmonary former smoker,    Pulmonary exam normal breath sounds clear to auscultation       Cardiovascular negative cardio ROS Normal cardiovascular exam Rhythm:Regular Rate:Normal     Neuro/Psych Anxiety negative neurological ROS  negative psych ROS   GI/Hepatic negative GI ROS, Neg liver ROS,   Endo/Other  negative endocrine ROS  Renal/GU negative Renal ROS     Musculoskeletal negative musculoskeletal ROS (+)   Abdominal (+) + obese,   Peds  Hematology Lab Results      Component                Value               Date                      WBC                      10.6 (H)            12/30/2019                HGB                      12.0                12/30/2019                HCT                      36.9                12/30/2019                MCV                      90.9                12/30/2019                PLT                      217                 12/30/2019              Anesthesia Other Findings   Reproductive/Obstetrics (+) Pregnancy                             Anesthesia Physical Anesthesia Plan  ASA: III  Anesthesia Plan: Epidural   Post-op Pain Management:    Induction:   PONV Risk Score and Plan:   Airway Management Planned:   Additional Equipment:   Intra-op Plan:   Post-operative Plan:   Informed Consent: I have reviewed the patients History and Physical, chart, labs and discussed the procedure including the risks, benefits and alternatives for the proposed anesthesia with the patient or authorized representative who has indicated  his/her understanding and acceptance.       Plan Discussed with:  Anesthesia Plan Comments: (39.3 Wk G3P1 for LEA)        Anesthesia Quick Evaluation

## 2019-12-30 NOTE — H&P (Addendum)
Jill Ruiz is a 34 y.o. female presenting for labor. OB History    Gravida  3   Para  1   Term  1   Preterm      AB  1   Living  1     SAB  1   TAB      Ectopic      Multiple  0   Live Births  1          Past Medical History:  Diagnosis Date  . Anxiety   . Infertility, female   . LGA (large for gestational age) fetus affecting management of mother    Past Surgical History:  Procedure Laterality Date  . WISDOM TOOTH EXTRACTION     Family History: family history includes Anxiety disorder in her mother; Asthma in her father; Cancer in her mother. Social History:  reports that she has quit smoking. Her smoking use included cigarettes. She quit after 4.00 years of use. She has never used smokeless tobacco. She reports previous alcohol use. She reports that she does not use drugs.     Maternal Diabetes: No Genetic Screening: Normal Maternal Ultrasounds/Referrals: Normal Fetal Ultrasounds or other Referrals:  None Maternal Substance Abuse:  No Significant Maternal Medications:  None  zoloft Significant Maternal Lab Results:  Group B Strep negative Other Comments:  None  Review of Systems  Constitutional: Negative.   All other systems reviewed and are negative.  Maternal Medical History:  Reason for admission: Contractions.   Contractions: Onset was 3-5 hours ago.   Frequency: regular.    Fetal activity: Perceived fetal activity is normal.    Prenatal complications: Polyhydramnios.   Prenatal Complications - Diabetes: none.    Dilation: 6 Effacement (%): 70 Station: -2 Exam by:: [name], RN Blood pressure 101/61, pulse 89, temperature 98.3 F (36.8 C), temperature source Oral, resp. rate 16, height 5\' 4"  (1.626 m), weight 98.2 kg, SpO2 99 %, unknown if currently breastfeeding. Maternal Exam:  Uterine Assessment: Contraction strength is moderate.  Contraction frequency is irregular.   Abdomen: Patient reports no abdominal  tenderness. Fetal presentation: vertex  Introitus: Normal vulva. Normal vagina.  Ferning test: not done.  Nitrazine test: not done. Amniotic fluid character: not assessed.  Pelvis: adequate for delivery.   Cervix: Cervix evaluated by digital exam.     Physical Exam Vitals and nursing note reviewed.  Constitutional:      Appearance: Normal appearance.  HENT:     Head: Normocephalic and atraumatic.  Cardiovascular:     Rate and Rhythm: Normal rate and regular rhythm.     Pulses: Normal pulses.     Heart sounds: Normal heart sounds.  Pulmonary:     Effort: Pulmonary effort is normal.     Breath sounds: Normal breath sounds.  Abdominal:     General: Bowel sounds are normal.  Genitourinary:    General: Normal vulva.  Musculoskeletal:        General: Normal range of motion.     Cervical back: Normal range of motion and neck supple.  Skin:    General: Skin is warm and dry.  Neurological:     General: No focal deficit present.     Mental Status: She is alert and oriented to person, place, and time.  Psychiatric:        Mood and Affect: Mood normal.        Behavior: Behavior normal.     Prenatal labs: ABO, Rh: --/--/O POS (08/05 0216)  Antibody: NEG (08/05 0216) Rubella: Immune (01/07 0000) RPR: Nonreactive (01/07 0000)  HBsAg: Negative (01/07 0000)  HIV: Non-reactive (01/07 0000)  GBS:   neg  Assessment/Plan: 39+ week IUP LGA Active labor   Jill Ruiz 12/30/2019, 6:30 AM

## 2019-12-30 NOTE — Anesthesia Procedure Notes (Signed)
Epidural Patient location during procedure: OB Start time: 12/30/2019 3:43 AM End time: 12/30/2019 3:59 AM  Staffing Anesthesiologist: Trevor Iha, MD Performed: anesthesiologist   Preanesthetic Checklist Completed: patient identified, IV checked, site marked, risks and benefits discussed, surgical consent, monitors and equipment checked, pre-op evaluation and timeout performed  Epidural Patient position: sitting Prep: DuraPrep and site prepped and draped Patient monitoring: continuous pulse ox and blood pressure Approach: midline Location: L3-L4 Injection technique: LOR air  Needle:  Needle type: Tuohy  Needle gauge: 17 G Needle length: 9 cm and 9 Needle insertion depth: 7 cm Catheter type: closed end flexible Catheter size: 19 Gauge Catheter at skin depth: 13 cm Test dose: negative  Assessment Events: blood not aspirated, injection not painful, no injection resistance, no paresthesia and negative IV test  Additional Notes Patient identified. Risks/Benefits/Options discussed with patient including but not limited to bleeding, infection, nerve damage, paralysis, failed block, incomplete pain control, headache, blood pressure changes, nausea, vomiting, reactions to medication both or allergic, itching and postpartum back pain. Confirmed with bedside nurse the patient's most recent platelet count. Confirmed with patient that they are not currently taking any anticoagulation, have any bleeding history or any family history of bleeding disorders. Patient expressed understanding and wished to proceed. All questions were answered. Sterile technique was used throughout the entire procedure. Please see nursing notes for vital signs. Test dose was given through epidural needle and negative prior to continuing to dose epidural or start infusion. Warning signs of high block given to the patient including shortness of breath, tingling/numbness in hands, complete motor block, or any concerning  symptoms with instructions to call for help. Patient was given instructions on fall risk and not to get out of bed. All questions and concerns addressed with instructions to call with any issues. 1 Attempt (S) . Patient tolerated procedure well.

## 2019-12-30 NOTE — Progress Notes (Signed)
[name] was referred for history of depression/anxiety. * Referral screened out by Clinical Social Worker because none of the following criteria appear to apply: ~ History of anxiety/depression during this pregnancy, or of post-partum depression following prior delivery. ~ Diagnosis of anxiety and/or depression within last 3 years OR * [name]'s symptoms currently being treated with medication and/or therapy. Per further chart review, [name] on Zoloft.     Please contact the Clinical Social Worker if needs arise, by [name] request, or if [name] scores greater than 9/yes to question 10 on Edinburgh Postpartum Depression Screen.    Jill Ruiz, MSW, LCSW [facility] [phone]    

## 2019-12-31 ENCOUNTER — Inpatient Hospital Stay (HOSPITAL_COMMUNITY)
Admission: AD | Admit: 2019-12-31 | Payer: BC Managed Care – PPO | Source: Home / Self Care | Admitting: Obstetrics and Gynecology

## 2019-12-31 ENCOUNTER — Inpatient Hospital Stay (HOSPITAL_COMMUNITY): Payer: BC Managed Care – PPO

## 2019-12-31 LAB — CBC
HCT: 32.2 % — ABNORMAL LOW (ref 36.0–46.0)
Hemoglobin: 10.2 g/dL — ABNORMAL LOW (ref 12.0–15.0)
MCH: 29 pg (ref 26.0–34.0)
MCHC: 31.7 g/dL (ref 30.0–36.0)
MCV: 91.5 fL (ref 80.0–100.0)
Platelets: 177 10*3/uL (ref 150–400)
RBC: 3.52 MIL/uL — ABNORMAL LOW (ref 3.87–5.11)
RDW: 16 % — ABNORMAL HIGH (ref 11.5–15.5)
WBC: 10.3 10*3/uL (ref 4.0–10.5)
nRBC: 0 % (ref 0.0–0.2)

## 2019-12-31 MED ORDER — BENZOCAINE-MENTHOL 20-0.5 % EX AERO: 1.0000 "application " | INHALATION_SPRAY | CUTANEOUS | 1 refills | Status: AC | PRN

## 2019-12-31 MED ORDER — ACETAMINOPHEN 325 MG PO TABS: 650.0000 mg | ORAL_TABLET | ORAL | 1 refills | Status: AC | PRN

## 2019-12-31 NOTE — Discharge Summary (Signed)
OB Discharge Summary  Patient Name: Jill Ruiz DOB: Jul 24, 1985 MRN: [id number]  Date of admission: 12/30/2019 Delivering provider: Olivia Mackie   Admitting diagnosis: Indication for care in labor or delivery [O75.9] Intrauterine pregnancy: [redacted]w[redacted]d     Secondary diagnosis: Patient Active Problem List   Diagnosis Date Noted  . Indication for care in labor or delivery 12/30/2019  . SVD (spontaneous vaginal delivery) 05/22/2018  . Postpartum care following vaginal delivery (8/5) 05/22/2018  . Second degree perineal laceration 05/22/2018  . Term pregnancy 05/19/2018   Additional problems: History of anxiety/depression, stable on Zoloft   Date of discharge: 12/31/2019   Discharge diagnosis: Principal Problem:   Postpartum care following vaginal delivery (8/5) Active Problems:   Term pregnancy   SVD (spontaneous vaginal delivery)   Second degree perineal laceration   Indication for care in labor or delivery                                                             Post partum procedures:None  Augmentation: AROM and Pitocin Pain control: Epidural  Laceration:2nd degree  Episiotomy:None  Complications: None  Hospital course:  Onset of Labor With Vaginal Delivery      34 y.o. yo [id number] at [redacted]w[redacted]d was admitted in Active Labor on 12/30/2019. Patient had an uncomplicated labor course as follows:  Membrane Rupture Time/Date: 8:15 AM ,12/30/2019   Delivery Method:Vaginal, Spontaneous  Episiotomy: None  Lacerations:  2nd degree  Patient had an uncomplicated postpartum course.  She is ambulating, tolerating a regular diet, passing flatus, and urinating well. Patient is discharged home in stable condition on 12/31/19.  Newborn Data: Birth date:12/30/2019  Birth time:10:11 AM  Gender:Female  Living status:Living  Apgars:9 ,9  Weight:3750 g   Physical exam  Vitals:   12/30/19 1718 12/30/19 2100 12/31/19 0100 12/31/19 0500  BP: 120/67 113/74 125/66 108/80  Pulse: 87 74 76 89  Resp:  17 16 16    Temp: 98.3 F (36.8 C) 98.1 F (36.7 C) 97.9 F (36.6 C) 98 F (36.7 C)  TempSrc: Oral Oral Oral   SpO2:    98%  Weight:      Height:       General: alert, cooperative and no distress Lochia: appropriate Uterine Fundus: firm Perineum: repair intact, no edema DVT Evaluation: No evidence of DVT seen on physical exam. Labs: Lab Results  Component Value Date   WBC 10.3 12/31/2019   HGB 10.2 (L) 12/31/2019   HCT 32.2 (L) 12/31/2019   MCV 91.5 12/31/2019   PLT 177 12/31/2019   No flowsheet data found. Edinburgh Postnatal Depression Scale Screening Tool 12/31/2019 05/22/2018  I have been able to laugh and see the funny side of things. 0 0  I have looked forward with enjoyment to things. 0 0  I have blamed myself unnecessarily when things went wrong. 0 1  I have been anxious or worried for no good reason. 0 2  I have felt scared or panicky for no good reason. 0 1  Things have been getting on top of me. 0 1  I have been so unhappy that I have had difficulty sleeping. 0 0  I have felt sad or miserable. 0 0  I have been so unhappy that I have been crying. 0 0  The thought of  harming myself has occurred to me. 0 0  Edinburgh Postnatal Depression Scale Total 0 5   Vaccines: TDaP UTD         Flu    Declined  Discharge instruction:  per After Visit Summary,  [facility] booklet and  "[facility]standing [facility] Baby [facility]" hospital booklet  After Visit Meds:  Allergies as of 12/31/2019   No Known Allergies     Medication List    STOP taking these medications   ibuprofen 600 MG tablet Commonly known as: ADVIL     TAKE these medications   acetaminophen 325 MG tablet Commonly known as: Tylenol Take 2 tablets (650 mg total) by mouth every 4 (four) hours as needed (for pain scale < 4).   benzocaine-Menthol 20-0.5 % Aero Commonly known as: DERMOPLAST Apply 1 application topically as needed for irritation (perineal discomfort).   prenatal multivitamin Tabs tablet Take  1 tablet by mouth daily at 12 noon.            Discharge Care Instructions  (From admission, onward)         Start     Ordered   12/31/19 0000  Discharge wound care:       Comments: Sitz baths 2 times /day with warm water x 1 week. May add herbals: 1 ounce dried comfrey leaf* 1 ounce calendula flowers 1 ounce lavender flowers  Supplies can be found online at [employer] sources at [employer], [employer]  1/2 ounce dried uva ursi leaves 1/2 ounce witch hazel blossoms (if you can find them) 1/2 ounce dried sage leaf 1/2 cup sea salt Directions: Bring 2 quarts of water to a boil. Turn off heat, and place 1 ounce (approximately 1 large handful) of the above mixed herbs (not the salt) into the pot. Steep, covered, for 30 minutes.  Strain the liquid well with a fine mesh strainer, and discard the herb material. Add 2 quarts of liquid to the tub, along with the 1/2 cup of salt. This medicinal liquid can also be made into compresses and peri-rinses.   12/31/19 1136         Diet: routine diet  Activity: Advance as tolerated. Pelvic rest for 6 weeks.   Postpartum contraception: Not Discussed  Newborn Data: Live born female  Birth Weight: 8 lb 4.3 oz (3750 g) APGAR: 9, 9  Newborn Delivery   Birth date/time: 12/30/2019 10:11:00 Delivery type: Vaginal, Spontaneous     Named [facility] Baby Feeding: Bottle Disposition:home with mother  Delivery Report:  Review the Delivery Report for details.    Follow up:  Follow-up Information    Olivia Mackie, MD. Schedule an appointment as soon as possible for a visit in 6 week(s).   Specialty: Obstetrics and Gynecology Why: Please make an appointment for 6 week postpartum visit.  Contact information: [address] [place] [address] [phone]              Signed: Clancy Gourd, MSN 12/31/2019, 11:37 AM

## 2020-01-01 NOTE — Progress Notes (Signed)
Post Partum Day 2 Subjective: no complaints, up ad lib, voiding, tolerating PO and + flatus  Objective: Blood pressure 111/74, pulse 83, temperature 98 F (36.7 C), temperature source Oral, resp. rate 18, height 5\' 4"  (1.626 m), weight 98.2 kg, SpO2 100 %, unknown if currently breastfeeding.  Physical Exam:  General: alert, cooperative and appears stated age Lochia: appropriate Uterine Fundus: firm Incision: healing well DVT Evaluation: No evidence of DVT seen on physical exam.  Recent Labs    12/30/19 0219 12/31/19 0623  HGB 12.0 10.2*  HCT 36.9 32.2*    Assessment/Plan: Discharge home   LOS: 2 days   Alanie Syler J 01/01/2020, 9:42 AM

## 2020-02-10 DIAGNOSIS — Z124 Encounter for screening for malignant neoplasm of cervix: Secondary | ICD-10-CM | POA: Diagnosis not present

## 2020-05-25 ENCOUNTER — Other Ambulatory Visit: Payer: BC Managed Care – PPO

## 2020-05-25 DIAGNOSIS — Z20822 Contact with and (suspected) exposure to covid-19: Secondary | ICD-10-CM | POA: Diagnosis not present

## 2020-05-29 LAB — NOVEL CORONAVIRUS, NAA: SARS-CoV-2, NAA: NOT DETECTED

## 2020-10-31 DIAGNOSIS — N939 Abnormal uterine and vaginal bleeding, unspecified: Secondary | ICD-10-CM | POA: Diagnosis not present

## 2021-01-01 DIAGNOSIS — F4323 Adjustment disorder with mixed anxiety and depressed mood: Secondary | ICD-10-CM | POA: Diagnosis not present

## 2021-01-29 DIAGNOSIS — F4323 Adjustment disorder with mixed anxiety and depressed mood: Secondary | ICD-10-CM | POA: Diagnosis not present

## 2021-02-28 DIAGNOSIS — F4323 Adjustment disorder with mixed anxiety and depressed mood: Secondary | ICD-10-CM | POA: Diagnosis not present

## 2021-04-02 DIAGNOSIS — F4323 Adjustment disorder with mixed anxiety and depressed mood: Secondary | ICD-10-CM | POA: Diagnosis not present
